# Patient Record
Sex: Female | Born: 1965 | Race: Black or African American | Hispanic: No | Smoking: Never smoker
Health system: Southern US, Community
[De-identification: ages and names within clinical notes are randomized; demographics above are authoritative.]

## PROBLEM LIST (undated history)

## (undated) DIAGNOSIS — D649 Anemia, unspecified: Secondary | ICD-10-CM

## (undated) DIAGNOSIS — E785 Hyperlipidemia, unspecified: Secondary | ICD-10-CM

## (undated) DIAGNOSIS — E119 Type 2 diabetes mellitus without complications: Secondary | ICD-10-CM

## (undated) DIAGNOSIS — I10 Essential (primary) hypertension: Secondary | ICD-10-CM

## (undated) DIAGNOSIS — E079 Disorder of thyroid, unspecified: Secondary | ICD-10-CM

## (undated) HISTORY — DX: Anemia, unspecified: D64.9

## (undated) HISTORY — PX: APPENDECTOMY: SHX54

## (undated) HISTORY — PX: ABDOMINAL HYSTERECTOMY: SHX81

---

## 1998-05-01 ENCOUNTER — Encounter: Admission: RE | Admit: 1998-05-01 | Discharge: 1998-05-01 | Payer: Self-pay | Admitting: *Deleted

## 1998-10-08 ENCOUNTER — Emergency Department (HOSPITAL_COMMUNITY): Admission: EM | Admit: 1998-10-08 | Discharge: 1998-10-08 | Payer: Self-pay | Admitting: Emergency Medicine

## 1999-01-19 ENCOUNTER — Encounter: Admission: RE | Admit: 1999-01-19 | Discharge: 1999-04-19 | Payer: Self-pay | Admitting: Family Medicine

## 1999-11-14 ENCOUNTER — Other Ambulatory Visit: Admission: RE | Admit: 1999-11-14 | Discharge: 1999-11-14 | Payer: Self-pay | Admitting: Obstetrics and Gynecology

## 2000-01-01 ENCOUNTER — Emergency Department (HOSPITAL_COMMUNITY): Admission: EM | Admit: 2000-01-01 | Discharge: 2000-01-01 | Payer: Self-pay | Admitting: Emergency Medicine

## 2003-01-14 ENCOUNTER — Other Ambulatory Visit: Admission: RE | Admit: 2003-01-14 | Discharge: 2003-01-14 | Payer: Self-pay | Admitting: Obstetrics and Gynecology

## 2003-01-19 ENCOUNTER — Encounter: Payer: Self-pay | Admitting: Obstetrics and Gynecology

## 2003-01-19 ENCOUNTER — Ambulatory Visit (HOSPITAL_COMMUNITY): Admission: RE | Admit: 2003-01-19 | Discharge: 2003-01-19 | Payer: Self-pay | Admitting: Obstetrics and Gynecology

## 2003-07-11 ENCOUNTER — Encounter: Admission: RE | Admit: 2003-07-11 | Discharge: 2003-10-09 | Payer: Self-pay | Admitting: Emergency Medicine

## 2003-08-17 ENCOUNTER — Encounter: Admission: RE | Admit: 2003-08-17 | Discharge: 2003-08-17 | Payer: Self-pay | Admitting: Emergency Medicine

## 2003-08-31 ENCOUNTER — Encounter: Admission: RE | Admit: 2003-08-31 | Discharge: 2003-08-31 | Payer: Self-pay | Admitting: Neurosurgery

## 2004-01-16 ENCOUNTER — Emergency Department (HOSPITAL_COMMUNITY): Admission: EM | Admit: 2004-01-16 | Discharge: 2004-01-16 | Payer: Self-pay | Admitting: Emergency Medicine

## 2004-02-17 ENCOUNTER — Encounter: Admission: RE | Admit: 2004-02-17 | Discharge: 2004-02-17 | Payer: Self-pay | Admitting: Emergency Medicine

## 2005-01-21 ENCOUNTER — Emergency Department (HOSPITAL_COMMUNITY): Admission: EM | Admit: 2005-01-21 | Discharge: 2005-01-21 | Payer: Self-pay | Admitting: Family Medicine

## 2005-04-25 IMAGING — CR DG CHEST 2V
2 series · 2 of 2 positions shown · non-contrast
Comparison: none

CLINICAL DATA: Diabetic, not feeling well.   Chest pain.
 TWO VIEW CHEST
 PA and lateral views of the chest show mild bilateral basilar atelectasis and bilateral cervical ribs.  There is no definite acute infiltrate, congestive heart failure, pleural effusion, or pneumothorax.   The heart is normal for the patient?s body habitus.  Bony thorax is normal except for the cervical ribs. 
 IMPRESSION
 Mild bilateral basilar atelectasis.  Bilateral large cervical ribs.  No acute disease.

[view not recorded (1 of 2)]
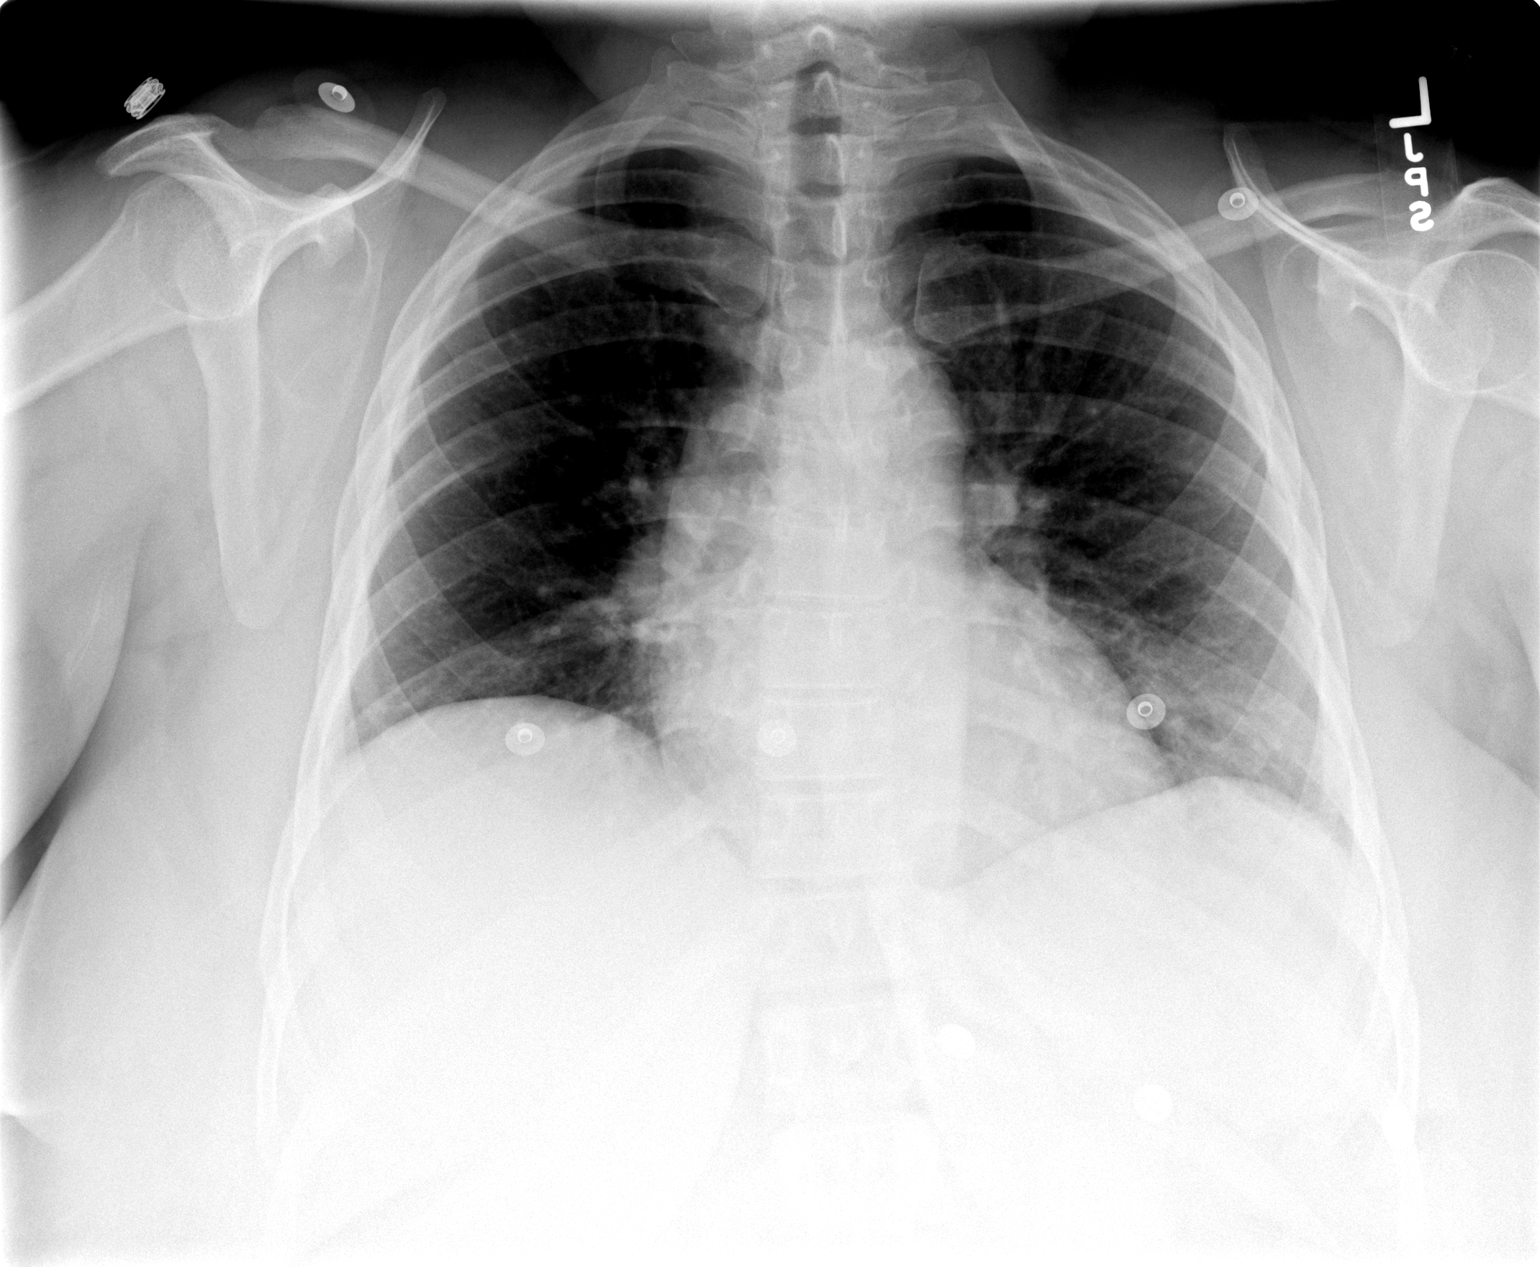

[view not recorded (2 of 2)]
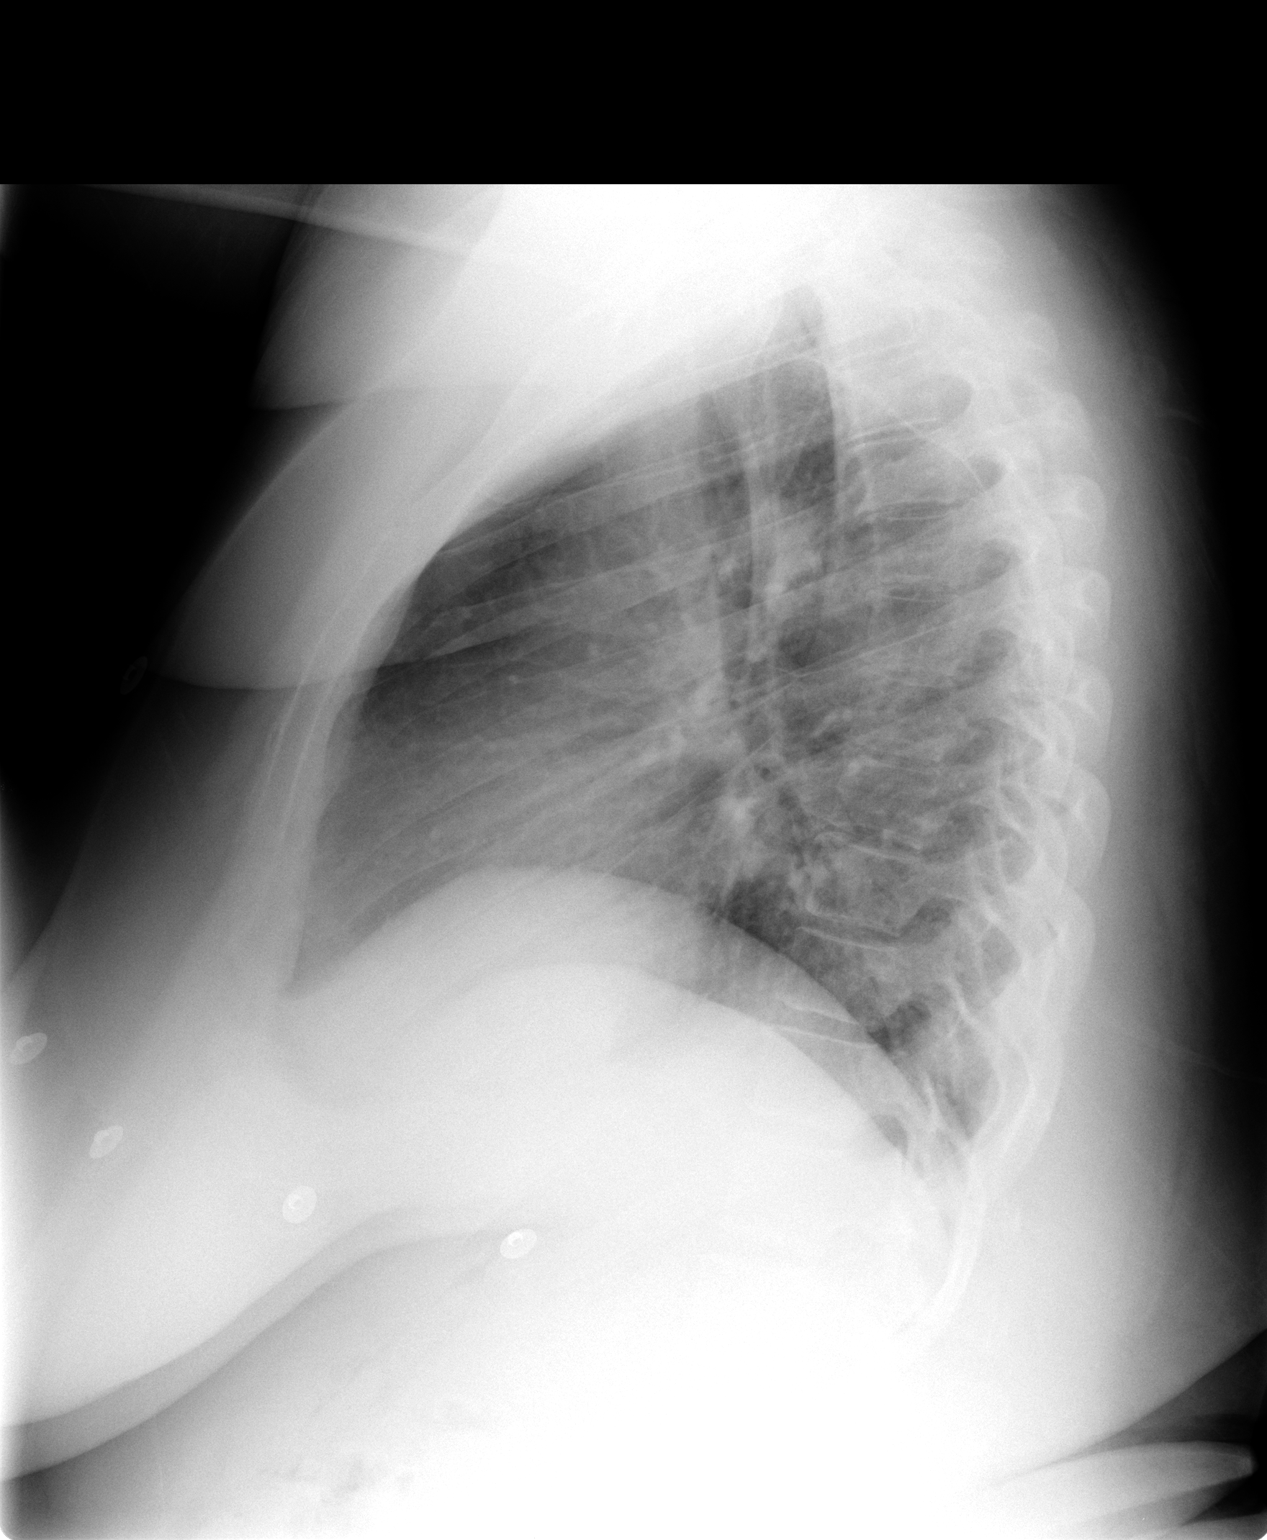

[2 of 2 positions shown; findings below may reference images not displayed]

## 2006-09-02 ENCOUNTER — Emergency Department (HOSPITAL_COMMUNITY): Admission: EM | Admit: 2006-09-02 | Discharge: 2006-09-02 | Payer: Self-pay | Admitting: Emergency Medicine

## 2008-09-13 ENCOUNTER — Ambulatory Visit (HOSPITAL_COMMUNITY): Admission: RE | Admit: 2008-09-13 | Discharge: 2008-09-14 | Payer: Self-pay | Admitting: Obstetrics and Gynecology

## 2008-09-13 ENCOUNTER — Encounter (INDEPENDENT_AMBULATORY_CARE_PROVIDER_SITE_OTHER): Payer: Self-pay | Admitting: Obstetrics and Gynecology

## 2009-06-02 ENCOUNTER — Encounter: Admission: RE | Admit: 2009-06-02 | Discharge: 2009-06-02 | Payer: Self-pay | Admitting: Obstetrics and Gynecology

## 2010-06-15 ENCOUNTER — Encounter: Admission: RE | Admit: 2010-06-15 | Discharge: 2010-06-15 | Payer: Self-pay | Admitting: Family Medicine

## 2010-10-06 ENCOUNTER — Encounter: Payer: Self-pay | Admitting: Neurosurgery

## 2011-01-29 NOTE — Op Note (Signed)
Virginia [name], Virginia [name]             ACCOUNT NO.:  [id number]   MEDICAL RECORD NO.:  [id number]          PATIENT TYPE:  OIB   LOCATION:  9316                          FACILITY:  [facility]   PHYSICIAN:  Zenaida Niece, M.D.DATE OF BIRTH:  1966/08/09   DATE OF PROCEDURE:  09/13/2008  DATE OF DISCHARGE:                               OPERATIVE REPORT   PREOPERATIVE DIAGNOSES:  Abnormal uterine bleeding and leiomyomatous  uterus.   POSTOPERATIVE DIAGNOSES:  Abnormal uterine bleeding and leiomyomatous  uterus.   PROCEDURE:  D&C and laparoscopic supracervical hysterectomy.   SURGEON:  Zenaida Niece, MD   ASSISTANT:  Huel Cote, MD   ANESTHESIA:  General endotracheal tube.   FINDINGS:  She had a large uterus with multiple fibroids.  She had  probable endometriosis at the left uterine cornu with otherwise normal  tubes, ovaries, and abdomen.  No tissue was obtained on the D&C.   SPECIMENS:  Morcellated uterus sent for routine pathology.   ESTIMATED BLOOD LOSS:  200 mL.   COMPLICATIONS:  None.   PROCEDURE IN DETAIL:  The patient was taken to the operating room and  placed in the dorsal supine position.  General anesthesia was induced  and she was placed in mobile stirrups and the shoulder bolsters were  attached to the bed.  Abdomen, perineum, and vagina were then prepped  and draped in the usual sterile fashion.  A Foley catheter was inserted.  A Graves speculum was inserted into the vagina after I double gloved.  The anterior lip of the cervix was grasped with a single-tooth  tenaculum.  The uterus sounded to 10 cm with some difficulty.  The  cervix was then gradually dilated to a size 23 dilator.  A small sharp  curette was inserted and sharp curettage was performed with return of no  significant tissue.  The uterus felt smooth with the curette.  Since no  tissue was obtained, I did not feel suspicious for endometrial  hyperplasia or neoplasia.  Thus, we elected to proceed  with laparoscopic  supracervical hysterectomy.  I took off my overgloves.  Supraumbilical  skin was infiltrated with 0.25% Marcaine due to the position of her  umbilicus on the abdomen.  A 1-cm vertical incision was made.  The  Veress needle was inserted in the peritoneal cavity and placement  confirmed by the water drop test and an opening pressure of 2 mmHg.  CO2  gas was insufflated to a pressure of 14 mmHg and the Veress needle was  removed.  A 5-mm trocar was then introduced through this incision with  direct visualization with a laparoscope.  Good visualization was  achieved.  The uterus was large filling the pelvis.  A 10-mm port was  then placed on the right side under direct visualization and a 12-mm  port on the left side under direct visualization.  The uterus did appear  to be mobile enough to attempt an [facility].  A 10-mm tenaculum was used as  well as the harmonic scalpel ACE.  First on the right side, the round  ligament, utero-ovarian pedicle, and broad ligament  were taken down with  harmonic scalpel ACE.  The superior portion of the right uterine artery  was skeletonized.  It was then taken down with harmonic scalpel with  adequate hemostasis.  On the right side, the right tube and ovary  appeared normal.  On the left side, the left cornu appeared to have some  adhesions and possibly some endometriosis.  The left fallopian tube and  utero-ovarian ligament were taken down with the harmonic scalpel ACE.  The left round ligament and the remainder of the utero-ovarian pedicle  were then taken down also with harmonic scalpel ACE.  This was followed  by the broad ligament.  Uterine artery was skeletonized.  Anterior  peritoneum was incised across the anterior portion of the uterus to push  the bladder inferior.  The left uterine artery was then taken down with  harmonic scalpel with adequate hemostasis.  I then used a drill, clamp,  cut technique with the harmonic scalpel on maximum  power to start  removing the uterus from the cervix from the left side.  Visualization  was slightly difficult due to the patient's obesity.  I was able to come  probably two-thirds of the way across from the left side.  Dr.  Senaida Ores then came across on the right side and removed the rest of  the pedicles again using the harmonic scalpel ACE on maximum power.  This freed the uterus from the cervix.  Bleeding from the posterior edge  of the cervical stump was eventually identified and controlled with the  harmonic scalpel ACE.  This was done after copious irrigation with the  irrigation suction device.  The uterus was placed in the right upper  quadrant to help visualize the pelvis.  Another 5-mm port had to be  placed in the left lower quadrant to help aid in visualization by  pushing bowel out of the way.  Bleeding was eventually able to be  controlled.  The 12-mm port on the left side was then removed and a  Storz morcellator was inserted.  Good visualization was achieved.  The  uterus was then located and brought to the morcellator.  With good  visualization and multiple bites, the uterus was removed through the  morcellator until no significant pieces of the uterus were left.  Again,  this was done with good visualization.  The morcellator was then removed  and a 12-mm port was replaced.  The pelvis was again irrigated and found  to be hemostatic.  A small amount of bleeding from the posterior  cervical stump was again controlled with the harmonic scalpel.  All  other pedicles appeared to be hemostatic.  A piece of Interceed was then  placed over the cervical stump.  The 5-mm port in the left lower  quadrant and a 10-mm port on the right side were removed under direct  visualization.  The 12-mm port was then removed.  The laparoscope was  left in place and used to visualize as I attempted to place a suture  through the fascia at the 12-mm site.  I am not sure whether I closed   fascia or not, but I got the bite as deep as I could with a figure-of-  eight suture of 0-Vicryl.  The remaining trocars were removed after all  gas was allowed to deflate from the abdomen.  Skin incisions were then  closed with interrupted subcuticular sutures of 4-0 Vicryl followed by  Dermabond.  The patient tolerated the procedure well.  She was extubated  in the operating room and taken to the recovery room in stable  condition.  Counts were correct.  She was given Ancef 1 g IV at the  beginning the procedure and had PAS hose on throughout the procedure.      Zenaida Niece, M.D.  Electronically Signed     TDM/MEDQ  D:  09/13/2008  T:  09/14/2008  Job:  [id number]

## 2011-01-29 NOTE — H&P (Signed)
Virginia [name], NAM             ACCOUNT NO.:  [id number]   MEDICAL RECORD NO.:  [id number]         PATIENT TYPE:  WAMB   LOCATION:                                FACILITY:  [facility]   PHYSICIAN:  Zenaida Niece, M.D.DATE OF BIRTH:  04-11-1966   DATE OF ADMISSION:  09/13/2008  DATE OF DISCHARGE:                              HISTORY & PHYSICAL   CHIEF COMPLAINT:  Abnormal uterine bleeding and probable fibroid uterus.   HISTORY OF PRESENT ILLNESS:  This is a 45 year old female para 1-0-0-1  who was seen for an annual exam in June 2008.  At that time, she was  having mostly regular periods that were heavy for 4 days.  She had some  irregular spotting.  She has previously undergone ThermaChoice  endometrial ablation for abnormal bleeding.  Her bleeding then continued  to increase; so, she has elected to proceed with definitive surgical  therapy with hysterectomy.  I was unable to do endometrial biopsy in the  office.  Her cervix was stenotic and she did not tolerate in office  dilation.  I attempted to perform a vaginal ultrasound to assess her  endometrium.  This also proved difficult.  I could see two good size  fibroids but was unable to clearly see the endometrium.  This will have  an effect on the route of surgery which will be discussed in the plan.   PAST OB HISTORY:  One cesarean section at term without complications.   PAST MEDICAL HISTORY:  Hypertension, hypothyroidism, Type II Diabetes.   PAST SURGICAL HISTORY:  Low transverse cesarean section and  appendectomy, as well as the above-mentioned ThermaChoice and  laparoscopic tubal fulguration.   ALLERGIES:  None known.   CURRENT MEDICATIONS:  She is on the blood pressure medicine and she is  unable to tell me the name of this.  She is also on thyroid replacement  and Metformin.   FAMILY HISTORY:  No GYN or colon cancer.   REVIEW OF SYSTEMS:  Normal bowel and bladder function.  No other  significant problems.   PHYSICAL EXAMINATION:  Weight is 329 pounds.  Last blood pressure in the  office was slightly elevated at 180/118.  Generally, this is an obese black female in no acute distress.  Neck is supple without lymphadenopathy or thyromegaly.  Lungs are clear to auscultation.  Heart has a regular rate and rhythm without murmur.  Abdomen is obese, nontender, nondistended without a palpable mass.  She  has a scar in her right lower quadrant and a transverse scar.  Extremities have no edema and are nontender.  On pelvic exam, external genitalia have no lesions.  On speculum exam,  she  has a normal cervix and Pap smear was normal.  The uterus seems to  be well-supported.  On bimanual exam, her uterus is difficult to palpate  and she has no adnexal masses.   ASSESSMENT:  Abnormal uterine bleeding that is persistent after having  ThermaChoice endometrial ablation.  All nonsurgical and surgical options  have been discussed with the patient and she wishes to proceed with  definitive surgical therapy.  All routes and options of hysterectomy  have been discussed.  All risks of surgery have been discussed.   Plan is to admit the patient on the day of surgery.  She will have a D&C  with a frozen section.  We will start her hysterectomy through the  laparoscope.  If the D&C is benign, she will have a laparoscopic  supracervical hysterectomy.  If there is hyperplasia or carcinoma, then  she will have a total laparoscopic hysterectomy.      Zenaida Niece, M.D.  Electronically Signed     TDM/MEDQ  D:  09/12/2008  T:  09/12/2008  Job:  [id number]

## 2011-06-21 LAB — GLUCOSE, CAPILLARY
Glucose-Capillary: 108 mg/dL — ABNORMAL HIGH (ref 70–99)
Glucose-Capillary: 110 mg/dL — ABNORMAL HIGH (ref 70–99)
Glucose-Capillary: 129 mg/dL — ABNORMAL HIGH (ref 70–99)
Glucose-Capillary: 131 mg/dL — ABNORMAL HIGH (ref 70–99)
Glucose-Capillary: 152 mg/dL — ABNORMAL HIGH (ref 70–99)
Glucose-Capillary: 220 mg/dL — ABNORMAL HIGH (ref 70–99)
Glucose-Capillary: 233 mg/dL — ABNORMAL HIGH (ref 70–99)
Glucose-Capillary: 235 mg/dL — ABNORMAL HIGH (ref 70–99)
Glucose-Capillary: 252 mg/dL — ABNORMAL HIGH (ref 70–99)

## 2011-06-21 LAB — BASIC METABOLIC PANEL
BUN: 12 mg/dL (ref 6–23)
CO2: 23 mEq/L (ref 19–32)
Calcium: 8.3 mg/dL — ABNORMAL LOW (ref 8.4–10.5)
Chloride: 92 mEq/L — ABNORMAL LOW (ref 96–112)
Creatinine, Ser: 0.78 mg/dL (ref 0.4–1.2)
GFR calc Af Amer: >60 mL/min (ref >60)
GFR calc non Af Amer: >60 mL/min (ref >60)
Glucose, Bld: 184 mg/dL — ABNORMAL HIGH (ref 70–99)
Potassium: 3.8 mEq/L (ref 3.5–5.1)
Sodium: 123 mEq/L — ABNORMAL LOW (ref 135–145)

## 2011-06-21 LAB — CBC
HCT: 23.3 % — ABNORMAL LOW (ref 36.0–46.0)
HCT: 29.1 % — ABNORMAL LOW (ref 36.0–46.0)
Hemoglobin: 7.4 g/dL — CL (ref 12.0–15.0)
Hemoglobin: 9.1 g/dL — ABNORMAL LOW (ref 12.0–15.0)
MCHC: 31.4 g/dL (ref 30.0–36.0)
MCHC: 31.8 g/dL (ref 30.0–36.0)
MCV: 77.2 fL — ABNORMAL LOW (ref 78.0–100.0)
MCV: 77.9 fL — ABNORMAL LOW (ref 78.0–100.0)
Platelets: 481 10*3/uL — ABNORMAL HIGH (ref 150–400)
Platelets: 573 10*3/uL — ABNORMAL HIGH (ref 150–400)
RBC: 2.99 MIL/uL — ABNORMAL LOW (ref 3.87–5.11)
RBC: 3.77 MIL/uL — ABNORMAL LOW (ref 3.87–5.11)
RDW: 15.6 % — ABNORMAL HIGH (ref 11.5–15.5)
RDW: 15.7 % — ABNORMAL HIGH (ref 11.5–15.5)
WBC: 9.5 10*3/uL (ref 4.0–10.5)
WBC: 9.7 10*3/uL (ref 4.0–10.5)

## 2011-06-21 LAB — PREGNANCY, URINE: Preg Test, Ur: NEGATIVE

## 2013-04-20 ENCOUNTER — Emergency Department (HOSPITAL_COMMUNITY): Payer: 59

## 2013-04-20 ENCOUNTER — Encounter (HOSPITAL_COMMUNITY): Payer: Self-pay | Admitting: Physical Medicine and Rehabilitation

## 2013-04-20 ENCOUNTER — Emergency Department (HOSPITAL_COMMUNITY)
Admission: EM | Admit: 2013-04-20 | Discharge: 2013-04-20 | Disposition: A | Discharge status: Home / Self Care | Payer: 59 | Attending: Emergency Medicine | Admitting: Emergency Medicine

## 2013-04-20 DIAGNOSIS — R0789 Other chest pain: Secondary | ICD-10-CM

## 2013-04-20 DIAGNOSIS — G5600 Carpal tunnel syndrome, unspecified upper limb: Secondary | ICD-10-CM | POA: Insufficient documentation

## 2013-04-20 DIAGNOSIS — E785 Hyperlipidemia, unspecified: Secondary | ICD-10-CM | POA: Insufficient documentation

## 2013-04-20 DIAGNOSIS — I10 Essential (primary) hypertension: Secondary | ICD-10-CM | POA: Insufficient documentation

## 2013-04-20 DIAGNOSIS — E119 Type 2 diabetes mellitus without complications: Secondary | ICD-10-CM | POA: Insufficient documentation

## 2013-04-20 DIAGNOSIS — E079 Disorder of thyroid, unspecified: Secondary | ICD-10-CM | POA: Insufficient documentation

## 2013-04-20 DIAGNOSIS — G5602 Carpal tunnel syndrome, left upper limb: Secondary | ICD-10-CM

## 2013-04-20 HISTORY — DX: Type 2 diabetes mellitus without complications: E11.9

## 2013-04-20 HISTORY — DX: Hyperlipidemia, unspecified: E78.5

## 2013-04-20 HISTORY — DX: Essential (primary) hypertension: I10

## 2013-04-20 HISTORY — DX: Disorder of thyroid, unspecified: E07.9

## 2013-04-20 LAB — BASIC METABOLIC PANEL
BUN: 10 mg/dL (ref 6–23)
Calcium: 9.1 mg/dL (ref 8.4–10.5)
GFR calc Af Amer: >90 mL/min (ref >90)
GFR calc non Af Amer: 85 mL/min — ABNORMAL LOW (ref >90)
Potassium: 3.8 mEq/L (ref 3.5–5.1)
Sodium: 136 mEq/L (ref 135–145)

## 2013-04-20 LAB — CBC
MCHC: 35.4 g/dL (ref 30.0–36.0)
Platelets: 381 10*3/uL (ref 150–400)
RDW: 12.5 % (ref 11.5–15.5)
WBC: 7.8 10*3/uL (ref 4.0–10.5)

## 2013-04-20 NOTE — ED Notes (Signed)
Pt also states diffuse chest pain today. Describes as constant dull sensation. 2/10 at the time.

## 2013-04-20 NOTE — ED Provider Notes (Signed)
Medical screening examination/treatment/procedure(s) were performed by resident-physician practitioner and as supervising physician I was immediately available for consultation/collaboration.  Pt presents with two likely unrelated complaints as above w/ L hand complaint likely due to prolonged typing, as well as intermittent chest aching.  EKG NSR, delta trop x2 not elevated, CXR unremarkable.  Pt safe to f/u for further CP w/u as outpt, about ACS.      Shanna Cisco, MD 04/20/13 [phone]

## 2013-04-20 NOTE — ED Provider Notes (Signed)
CSN: [id number]     Arrival date & time 04/20/13  0759 History     First MD Initiated Contact with Patient 04/20/13 [phone]     Chief Complaint  Patient presents with  . Numbness   (Consider location/radiation/quality/duration/timing/severity/associated sxs/prior Treatment) Patient is a 47 y.o. female presenting with chest pain. The history is provided by the patient.  Chest Pain Pain location:  Substernal area Pain quality: pressure   Pain radiates to:  Does not radiate Pain radiates to the back: no   Pain severity:  Mild Onset quality:  Gradual Timing:  Constant Progression:  Partially resolved Chronicity:  New Relieved by:  Nothing Exacerbated by: nothing. Ineffective treatments:  None tried Associated symptoms: no abdominal pain, no back pain, no cough, no diaphoresis, no dizziness, no dysphagia, no fatigue, no fever, no headache, no nausea, no numbness, no palpitations, no shortness of breath and not vomiting     Past Medical History  Diagnosis Date  . Hypertension   . Diabetes mellitus without complication   . Thyroid disease   . Hyperlipemia    History reviewed. No pertinent past surgical history. History reviewed. No pertinent family history. History  Substance Use Topics  . Smoking status: Never Smoker   . Smokeless tobacco: Not on file  . Alcohol Use: No   OB History   Grav Para Term Preterm Abortions TAB SAB Ect Mult Living                 Review of Systems  Constitutional: Negative for fever, chills, diaphoresis and fatigue.  HENT: Negative for ear pain, congestion, sore throat, facial swelling, mouth sores, trouble swallowing, neck pain and neck stiffness.   Eyes: Negative.   Respiratory: Negative for apnea, cough, chest tightness, shortness of breath and wheezing.   Cardiovascular: Positive for chest pain. Negative for palpitations and leg swelling.  Gastrointestinal: Negative for nausea, vomiting, abdominal pain, diarrhea and abdominal distention.   Genitourinary: Negative for hematuria, flank pain, vaginal discharge, difficulty urinating and menstrual problem.  Musculoskeletal: Negative for back pain and gait problem.  Skin: Negative for rash and wound.  Neurological: Negative for dizziness, tremors, seizures, syncope, facial asymmetry, numbness and headaches.  Psychiatric/Behavioral: Negative.   All other systems reviewed and are negative.    Allergies  Review of patient's allergies indicates no known allergies.  Home Medications   Current Outpatient Rx  Name  Route  Sig  Dispense  Refill  . Ketotifen Fumarate (ALLERGY EYE DROPS OP)   Ophthalmic   Apply 1 drop to eye 2 (two) times daily as needed (allergies).          BP 165/113  Pulse 83  Temp(Src) 99 F (37.2 C) (Oral)  Resp 19  SpO2 100% Physical Exam  Nursing note and vitals reviewed. Constitutional: She is oriented to person, place, and time. She appears well-developed and well-nourished. No distress.  HENT:  Head: Normocephalic and atraumatic.  Right Ear: External ear normal.  Left Ear: External ear normal.  Nose: Nose normal.  Mouth/Throat: Oropharynx is clear and moist. No oropharyngeal exudate.  Eyes: Conjunctivae and EOM are normal. Pupils are equal, round, and reactive to light. Right eye exhibits no discharge. Left eye exhibits no discharge.  Neck: Normal range of motion. Neck supple. No JVD present. No tracheal deviation present. No thyromegaly present.  Cardiovascular: Normal rate, regular rhythm, normal heart sounds and intact distal pulses.  Exam reveals no gallop and no friction rub.   No murmur heard. Pulmonary/Chest: Effort normal  and breath sounds normal. No respiratory distress. She has no wheezes. She has no rales. She exhibits no tenderness.  Abdominal: Soft. Bowel sounds are normal. She exhibits no distension. There is no tenderness. There is no rebound and no guarding.  Musculoskeletal: Normal range of motion.  Patient with positive  Tinnel and Phalen sign in the left wrist  Lymphadenopathy:    She has no cervical adenopathy.  Neurological: She is alert and oriented to person, place, and time. No cranial nerve deficit. Coordination normal.  Skin: Skin is warm. No rash noted. She is not diaphoretic.  Psychiatric: She has a normal mood and affect. Her behavior is normal. Judgment and thought content normal.    ED Course   Procedures (including critical care time)  Labs Reviewed  BASIC METABOLIC PANEL - Abnormal; Notable for the following:    Glucose, Bld 297 (*)    GFR calc non Af Amer 85 (*)    All other components within normal limits  CBC  TROPONIN I  TROPONIN I   Dg Chest 2 View  04/20/2013   *RADIOLOGY REPORT*  Clinical Data: Dry cough.  CHEST - 2 VIEW  Comparison: 01/16/2004  Findings: Heart and mediastinal contours are within normal limits. No focal opacities or effusions.  No acute bony abnormality.  IMPRESSION: No active cardiopulmonary disease.   Original Report Authenticated By: Charlett Nose, M.D.   1. Carpal tunnel syndrome of left wrist   2. Chest pressure     MDM  47 yr old F pt here with 2 complaints. Patient has noticed tingling and burning in the left 3rd through 5th fingers. She says it is worse after prolonged use. 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 It radiates up her wrist. There is a positive Phalen and Tinnel's sign. I think this most likely represents carpal tunnel syndrome. Secondly the patient started noticing intermittent "chest aching" substernally that started about 3 days ago and is waxing and waning. It is not pleuritic and non-exertional. It is a 2/10 right now. Patient has a family hx of heart disease, DM, HTN, so has risk factors for ACS. Her EKG shows NSR, but will evaluate her as a low risk chest pain patient. Her well's score is 0 and is PERC negative. Likely she can follow up with PCP for continued cardiac evaluation.  EKG: normal EKG, normal sinus rhythm, there are  no previous tracings available for comparison.   Normal 4 hour troponin. Will give instructions on what to do for carpal tunnel.  Case discussed with Dr. Catarina Hartshorn, MD 04/20/13 1541  Sherryl Manges, MD 04/20/13 [phone]

## 2013-04-20 NOTE — ED Notes (Signed)
Pt resting quietly at the time. Vital signs stable. States numbness/tingling to L hand and fingers. Denies chest pain. Respirations unlabored. Family at bedside.

## 2013-04-20 NOTE — ED Notes (Signed)
Pt presents to department for evaluation of numbness/tingling to L hand radiating to forearm and up to shoulder. Ongoing since Saturday, no relief of symptoms. 5/10 pain at the time. Pt is conscious alert and oriented x4. Denies chest pain. Respirations unlabored.

## 2013-04-20 NOTE — ED Notes (Signed)
Ortho paged at this time 

## 2013-04-20 NOTE — Progress Notes (Signed)
Orthopedic Tech Progress Note Patient Details:  Virginia [name] 02-Oct-1965 [id number] Applied Velcro splint to LUE.  Pulses, sensation, motion, capillary refill intact before and after splinting.  Capillary refill less than 2 seconds. Ortho Devices Type of Ortho Device: Wrist splint Ortho Device/Splint Location: LUE Ortho Device/Splint Interventions: Application   Lesle Chris 04/20/2013, 2:57 PM

## 2013-10-15 ENCOUNTER — Other Ambulatory Visit: Payer: Self-pay | Admitting: Physician Assistant

## 2013-10-15 ENCOUNTER — Ambulatory Visit (INDEPENDENT_AMBULATORY_CARE_PROVIDER_SITE_OTHER): Payer: 59 | Admitting: Physician Assistant

## 2013-10-15 ENCOUNTER — Telehealth: Payer: Self-pay

## 2013-10-15 VITALS — BP 180/128 | HR 94 | Temp 98.2°F | Resp 18 | Ht 62.5 in | Wt 302.0 lb

## 2013-10-15 DIAGNOSIS — R05 Cough: Secondary | ICD-10-CM

## 2013-10-15 DIAGNOSIS — J069 Acute upper respiratory infection, unspecified: Secondary | ICD-10-CM

## 2013-10-15 DIAGNOSIS — E039 Hypothyroidism, unspecified: Secondary | ICD-10-CM

## 2013-10-15 DIAGNOSIS — R059 Cough, unspecified: Secondary | ICD-10-CM

## 2013-10-15 DIAGNOSIS — E1059 Type 1 diabetes mellitus with other circulatory complications: Secondary | ICD-10-CM

## 2013-10-15 DIAGNOSIS — R062 Wheezing: Secondary | ICD-10-CM

## 2013-10-15 DIAGNOSIS — I10 Essential (primary) hypertension: Secondary | ICD-10-CM

## 2013-10-15 DIAGNOSIS — R0602 Shortness of breath: Secondary | ICD-10-CM

## 2013-10-15 DIAGNOSIS — E119 Type 2 diabetes mellitus without complications: Secondary | ICD-10-CM

## 2013-10-15 LAB — POCT CBC
GRANULOCYTE PERCENT: 44.7 % (ref 37–80)
HCT, POC: 47.8 % (ref 37.7–47.9)
Hemoglobin: 14.8 g/dL (ref 12.2–16.2)
Lymph, poc: 2.8 (ref 0.6–3.4)
MCH: 30.1 pg (ref 27–31.2)
MCHC: 31 g/dL — AB (ref 31.8–35.4)
MCV: 97.3 fL — AB (ref 80–97)
MID (CBC): 0.6 (ref 0–0.9)
MPV: 8.5 fL (ref 0–99.8)
PLATELET COUNT, POC: 360 10*3/uL (ref 142–424)
POC Granulocyte: 2.8 (ref 2–6.9)
POC LYMPH %: 44.9 % (ref 10–50)
POC MID %: 10.4 % (ref 0–12)
RBC: 4.91 M/uL (ref 4.04–5.48)
RDW, POC: 13.4 %
WBC: 6.2 10*3/uL (ref 4.6–10.2)

## 2013-10-15 LAB — COMPREHENSIVE METABOLIC PANEL
ALT: 17 U/L (ref 0–35)
AST: 16 U/L (ref 0–37)
Albumin: 4.1 g/dL (ref 3.5–5.2)
Alkaline Phosphatase: 75 U/L (ref 39–117)
BILIRUBIN TOTAL: 0.6 mg/dL (ref 0.2–1.2)
BUN: 13 mg/dL (ref 6–23)
CO2: 28 meq/L (ref 19–32)
CREATININE: 0.84 mg/dL (ref 0.50–1.10)
Calcium: 9.5 mg/dL (ref 8.4–10.5)
Chloride: 99 mEq/L (ref 96–112)
GLUCOSE: 255 mg/dL — AB (ref 70–99)
Potassium: 4 mEq/L (ref 3.5–5.3)
Sodium: 136 mEq/L (ref 135–145)
Total Protein: 7.8 g/dL (ref 6.0–8.3)

## 2013-10-15 LAB — POCT GLYCOSYLATED HEMOGLOBIN (HGB A1C): Hemoglobin A1C: 10.7

## 2013-10-15 LAB — GLUCOSE, POCT (MANUAL RESULT ENTRY): POC GLUCOSE: 260 mg/dL — AB (ref 70–99)

## 2013-10-15 LAB — POCT INFLUENZA A/B
INFLUENZA B, POC: NEGATIVE
Influenza A, POC: NEGATIVE

## 2013-10-15 LAB — TSH: TSH: 4.831 u[IU]/mL — AB (ref 0.350–4.500)

## 2013-10-15 MED ORDER — AZITHROMYCIN 250 MG PO TABS: ORAL_TABLET | ORAL | Status: DC

## 2013-10-15 MED ORDER — METFORMIN HCL 500 MG PO TABS: 500.0000 mg | ORAL_TABLET | Freq: Two times a day (BID) | ORAL | Status: DC

## 2013-10-15 MED ORDER — INSULIN GLARGINE 100 UNIT/ML SOLOSTAR PEN: PEN_INJECTOR | SUBCUTANEOUS | Status: DC

## 2013-10-15 MED ORDER — ALBUTEROL SULFATE (2.5 MG/3ML) 0.083% IN NEBU
2.5000 mg | INHALATION_SOLUTION | Freq: Once | RESPIRATORY_TRACT | Status: AC
Start: 2013-10-15 — End: 2013-10-15
  Administered 2013-10-15: 2.5 mg via RESPIRATORY_TRACT

## 2013-10-15 MED ORDER — HYDROCODONE-HOMATROPINE 5-1.5 MG/5ML PO SYRP: ORAL_SOLUTION | ORAL | Status: DC

## 2013-10-15 MED ORDER — IPRATROPIUM BROMIDE 0.06 % NA SOLN: 2.0000 | Freq: Three times a day (TID) | NASAL | Status: DC

## 2013-10-15 MED ORDER — LISINOPRIL-HYDROCHLOROTHIAZIDE 20-12.5 MG PO TABS: 1.0000 | ORAL_TABLET | Freq: Every day | ORAL | Status: DC

## 2013-10-15 NOTE — Telephone Encounter (Signed)
Patient states that she was seen today and got a [facility] however she needs the needles to go with it. [facility] [address]  [phone]

## 2013-10-15 NOTE — Progress Notes (Signed)
Subjective:    Patient ID: Virginia [name], female    DOB: 11-27-1965, 47 y.o.   MRN: [id number]  HPI 48 year old female presents with a 5 day history of nasal congestion, post nasal drip, sore throat, and cough. Mild sinus pressure. Subjective fever and chills. Nasal congestion thick and green/yellow. Cough is sometimes productive of yellow sputum, but is mostly not productive. Cough is not associated with time of day. Ears feel full, leading to sensation of muffled hearing. Has tried OTC cold preps without success. Some diarrhea. Appetite decreased. Multiple sick contacts at work. No recent antibiotics or recent travels. She did get an influenza vaccine this season. No leg trauma, sedentary periods, h/o cancer, or tobacco use.  Her blood pressure is generally high because she has not had her medications in over 2 years. She cannot remember what she is supposed to be taking for her blood pressure. She was taking taking medication for hypertension, hypothyroid, and AODM. She was having to use insulin to control her blood sugars. The only medications she can remember are metformin and insulin. Her last sugar in Epic was 297 on 04/20/13. She was not on any medications at that time. She was using [facility] on [address] when she was taking her medications.   She has been a diabetic since her early 61's or late 20's. She was diagnosed with hypothyroidism in her early 20's. She was diagnosed with hypertension in her last 20's or early 56's.    PMH: Past Medical History  Diagnosis Date  . Hypertension   . Diabetes mellitus without complication   . Thyroid disease   . Hyperlipemia      Home Meds: Prior to Admission medications   Medication Sig Start Date End Date Taking? Authorizing Provider           Allergies: No Known Allergies  History   Social History  . Marital Status: Divorced    Spouse Name: N/A    Number of Children: N/A  . Years of Education: N/A   Occupational History  . Not  on file.   Social History Main Topics  . Smoking status: Never Smoker   . Smokeless tobacco: Not on file  . Alcohol Use: No  . Drug Use: No  . Sexual Activity: Not on file   Other Topics Concern  . Not on file   Social History Narrative  . No narrative on file      Review of Systems  Constitutional: Positive for fever, chills, appetite change and fatigue.  HENT: Positive for congestion, rhinorrhea, sinus pressure and sneezing. Negative for ear pain, hearing loss, postnasal drip and sore throat.        Nasal and chest congestion. Some laryngitis.   Respiratory: Positive for cough, chest tightness, shortness of breath and wheezing.        Cough is sometimes productive of yellow sputum.  Cough is not associated with time of day.   Gastrointestinal: Positive for diarrhea. Negative for nausea and vomiting.       Diarrhea associated with coughing and sneezing.   Endocrine: Positive for polydipsia, polyphagia and polyuria.       Positive for nocturia.   Genitourinary: Positive for urgency and frequency.  Musculoskeletal: Positive for myalgias.  Neurological: Positive for headaches.       Headache is located bilateral temples.        Objective:   Physical Exam  Physical Exam: Blood pressure 180/128, pulse 94, temperature 98.2 F (36.8 C), temperature  source Oral, resp. rate 18, height 5' 2.5" (1.588 m), weight 302 lb (136.986 kg), SpO2 96.00%., Body mass index is 54.32 kg/(m^2). General: Well developed, well nourished, in no acute distress. Head: Normocephalic, atraumatic, eyes without discharge, sclera non-icteric, nares are congested. Bilateral auditory canals clear, TM's are without perforation, pearly grey with reflective cone of light bilaterally. No sinus TTP. Oral cavity moist, dentition normal. Posterior pharynx with post nasal drip and mild erythema. No peritonsillar abscess or tonsillar exudate. Uvula midline.  Neck: Supple. No thyromegaly. Full ROM. No  lymphadenopathy. Lungs: Clear to auscultation bilaterally with mild wheezing. No rales or rhonchi. Breathing is unlabored. Status post albuterol neb: A little better.  Heart: RRR with S1 S2. No murmurs, rubs, or gallops appreciated. Msk:  Strength and tone normal for age. Extremities: No clubbing or cyanosis. No edema. Neuro: Alert and oriented X 3. Moves all extremities spontaneously. CNII-XII grossly in tact. Psych:  Responds to questions appropriately with a normal affect.    Labs: Results for orders placed in visit on 10/15/13  POCT CBC      Result Value Range   WBC 6.2  4.6 - 10.2 K/uL   Lymph, poc 2.8  0.6 - 3.4   POC LYMPH PERCENT 44.9  10 - 50 %L   MID (cbc) 0.6  0 - 0.9   POC MID % 10.4  0 - 12 %M   POC Granulocyte 2.8  2 - 6.9   Granulocyte percent 44.7  37 - 80 %G   RBC 4.91  4.04 - 5.48 M/uL   Hemoglobin 14.8  12.2 - 16.2 g/dL   HCT, POC 47.8  37.7 - 47.9 %   MCV 97.3 (*) 80 - 97 fL   MCH, POC 30.1  27 - 31.2 pg   MCHC 31.0 (*) 31.8 - 35.4 g/dL   RDW, POC 13.4     Platelet Count, POC 360  142 - 424 K/uL   MPV 8.5  0 - 99.8 fL  POCT INFLUENZA A/B      Result Value Range   Influenza A, POC Negative     Influenza B, POC Negative    GLUCOSE, POCT (MANUAL RESULT ENTRY)      Result Value Range   POC Glucose 260 (*) 70 - 99 mg/dl  POCT GLYCOSYLATED HEMOGLOBIN (HGB A1C)      Result Value Range   Hemoglobin A1C 10.7      CMP and TSH pending     Assessment & Plan:  48 year old female with uncontrolled AODM, uncontrolled hypertension, hypothyroidism, and URI. She has been off her chronic medications now for over 2 years.   1) AODM -Uncontrolled -Restart metformin 500 mg 1 po bid #60 RF 2 -Lantus Inject 10 units qhs. Advance by 2 units every 2 days until a FBS of 130. RF 11 -She declines Glipizide at this time -Has glucometer, lancets, and test strips at home -Advised patient further medications will be necessary including a statin and ASA -She will need DDS and  eye MD exam -She will need PNA vaccine -Up to date with influenza vaccine -Recheck 1 month  2) Hypertension -Uncontrolled -Start lisinopril/HCTZ 20/12.5 mg 1 po daily #90 no RF -Healthy diet and exercise -Weight loss -Recheck 1 month -Await CMP  3) Hypothyroidism -Await TSH -Plan to start levothyroxine based on TSH results  4) URI -Azithromycin 250 MG #6 2 po first day then 1 po next 4 days no RF -Atrovent NS 0.06% 2 sprays each nare  bid prn #1 no RF -Hycodan #4oz 1 tsp po q 4-6 hours prn cough no RF SED -Mucinex -Rest/fluids -RTC precautions   Christell Faith, MHS, PA-C [facility] [address] [place], [place] [address] [place] 10/15/2013 12:07 PM

## 2013-10-16 ENCOUNTER — Other Ambulatory Visit: Payer: Self-pay | Admitting: Physician Assistant

## 2013-10-16 DIAGNOSIS — E039 Hypothyroidism, unspecified: Secondary | ICD-10-CM

## 2013-10-16 LAB — T3: T3 TOTAL: 84.3 ng/dL (ref 80.0–204.0)

## 2013-10-16 LAB — T4, FREE: FREE T4: 0.91 ng/dL (ref 0.80–1.80)

## 2013-10-16 MED ORDER — INSULIN PEN NEEDLE 32G X 6 MM MISC: Status: DC

## 2013-10-16 MED ORDER — LEVOTHYROXINE SODIUM 25 MCG PO TABS
25.0000 ug | ORAL_TABLET | Freq: Every day | ORAL | Status: DC
Start: 2013-10-16 — End: 2014-03-01

## 2013-10-16 NOTE — Telephone Encounter (Signed)
Sent in

## 2013-10-16 NOTE — Telephone Encounter (Signed)
Called pt LMOM, advised Rx sent in.

## 2013-10-16 NOTE — Addendum Note (Signed)
Addended by: Rise Mu on: 10/16/2013 07:54 AM   Modules accepted: Orders

## 2014-03-01 ENCOUNTER — Ambulatory Visit (INDEPENDENT_AMBULATORY_CARE_PROVIDER_SITE_OTHER): Payer: 59 | Admitting: Emergency Medicine

## 2014-03-01 ENCOUNTER — Ambulatory Visit (INDEPENDENT_AMBULATORY_CARE_PROVIDER_SITE_OTHER): Payer: 59

## 2014-03-01 VITALS — BP 152/102 | HR 105 | Temp 98.8°F | Resp 18 | Ht 62.5 in | Wt 311.8 lb

## 2014-03-01 DIAGNOSIS — E119 Type 2 diabetes mellitus without complications: Secondary | ICD-10-CM

## 2014-03-01 DIAGNOSIS — M25569 Pain in unspecified knee: Secondary | ICD-10-CM

## 2014-03-01 DIAGNOSIS — M25561 Pain in right knee: Secondary | ICD-10-CM

## 2014-03-01 DIAGNOSIS — IMO0002 Reserved for concepts with insufficient information to code with codable children: Secondary | ICD-10-CM

## 2014-03-01 DIAGNOSIS — M1710 Unilateral primary osteoarthritis, unspecified knee: Secondary | ICD-10-CM

## 2014-03-01 DIAGNOSIS — M1711 Unilateral primary osteoarthritis, right knee: Secondary | ICD-10-CM

## 2014-03-01 MED ORDER — MELOXICAM 15 MG PO TABS: 15.0000 mg | ORAL_TABLET | Freq: Every day | ORAL | Status: DC

## 2014-03-01 MED ORDER — GLUCOSE BLOOD VI STRP: ORAL_STRIP | Status: DC

## 2014-03-01 NOTE — Progress Notes (Signed)
   Subjective:    Patient ID: Virginia [name], female    DOB: 1966/04/03, 48 y.o.   MRN: [id number]  HPI  48 yo female with right knee pain.  Onset gradually over last week.  No injury.  No swelling.  Hurst on anterior and popliteal fossa.  Worse when standing or rising from chair.  No radiation of pain.  Worse on stairs.  PPMH:  Morbid obesity, diabetes, hypertension  SH:  Nonsmoker, no alcohol  Review of Systems  Constitutional: Negative for fever and chills.  Respiratory: Negative for cough, shortness of breath and wheezing.   Cardiovascular: Negative for chest pain and leg swelling.  Endocrine: Negative for polydipsia and polyuria.  Musculoskeletal: Positive for arthralgias. Negative for back pain, gait problem, joint swelling and myalgias.  Skin: Negative for color change and rash.       Objective:   Physical Exam Blood pressure 152/102, pulse 105, temperature 98.8 F (37.1 C), temperature source Oral, resp. rate 18, height 5' 2.5" (1.588 m), weight 311 lb 12.8 oz (141.432 kg), SpO2 96.00%. Body mass index is 56.08 kg/(m^2). Well-developed, well nourished female who is awake, alert and oriented, in NAD. HEENT: [place]/AT, PERRL, EOMI.  Sclera and conjunctiva are clear.  Heart: no pedal edema Lungs: normal effort Extremities: right knee:  Mild ttp med/lat joint line, no ligamentous laxity, no effusion, mild patellar creptitus. Skin: warm and dry without rash. Psychologic: good mood and appropriate affect, normal speech and behavior.   Right knee Xray:  Moderate DJD of right knee.     Assessment & Plan:  Right Knee pain Right Knee osteoarthritis Diabetes  Will start on Mobic daily.  Refill diabetic test strips.  If no improvement patient will follow up and consider corticosteroid injection in to knee.  Deferred today as she has not been following blood sugar closely recently.

## 2014-03-02 NOTE — Progress Notes (Signed)
I have read, reviewed and agree with note by Dr. Meredith Staggers. Lamar Blinks, MD

## 2015-02-07 ENCOUNTER — Encounter (HOSPITAL_COMMUNITY): Payer: Self-pay | Admitting: Vascular Surgery

## 2015-02-07 ENCOUNTER — Emergency Department (HOSPITAL_COMMUNITY)
Admission: EM | Admit: 2015-02-07 | Discharge: 2015-02-07 | Disposition: A | Discharge status: Home / Self Care | Payer: 59 | Attending: Emergency Medicine | Admitting: Emergency Medicine

## 2015-02-07 DIAGNOSIS — R002 Palpitations: Secondary | ICD-10-CM | POA: Diagnosis present

## 2015-02-07 DIAGNOSIS — Z794 Long term (current) use of insulin: Secondary | ICD-10-CM | POA: Insufficient documentation

## 2015-02-07 DIAGNOSIS — I10 Essential (primary) hypertension: Secondary | ICD-10-CM | POA: Insufficient documentation

## 2015-02-07 DIAGNOSIS — Z791 Long term (current) use of non-steroidal anti-inflammatories (NSAID): Secondary | ICD-10-CM | POA: Insufficient documentation

## 2015-02-07 DIAGNOSIS — Z79899 Other long term (current) drug therapy: Secondary | ICD-10-CM | POA: Diagnosis not present

## 2015-02-07 DIAGNOSIS — E119 Type 2 diabetes mellitus without complications: Secondary | ICD-10-CM | POA: Insufficient documentation

## 2015-02-07 LAB — BASIC METABOLIC PANEL
Anion gap: 10 (ref 5–15)
BUN: 9 mg/dL (ref 6–20)
CO2: 23 mmol/L (ref 22–32)
Calcium: 8.8 mg/dL — ABNORMAL LOW (ref 8.9–10.3)
Chloride: 100 mmol/L — ABNORMAL LOW (ref 101–111)
Creatinine, Ser: 0.75 mg/dL (ref 0.44–1.00)
GFR calc Af Amer: >60 mL/min (ref >60)
GFR calc non Af Amer: >60 mL/min (ref >60)
Glucose, Bld: 315 mg/dL — ABNORMAL HIGH (ref 65–99)
Potassium: 4 mmol/L (ref 3.5–5.1)
Sodium: 133 mmol/L — ABNORMAL LOW (ref 135–145)

## 2015-02-07 LAB — CBC WITH DIFFERENTIAL/PLATELET
Basophils Absolute: 0 10*3/uL (ref 0.0–0.1)
Basophils Relative: 1 % (ref 0–1)
Eosinophils Absolute: 0.1 10*3/uL (ref 0.0–0.7)
Eosinophils Relative: 2 % (ref 0–5)
HCT: 41 % (ref 36.0–46.0)
Hemoglobin: 14.2 g/dL (ref 12.0–15.0)
Lymphocytes Relative: 32 % (ref 12–46)
Lymphs Abs: 2.2 10*3/uL (ref 0.7–4.0)
MCH: 30.8 pg (ref 26.0–34.0)
MCHC: 34.6 g/dL (ref 30.0–36.0)
MCV: 88.9 fL (ref 78.0–100.0)
Monocytes Absolute: 0.4 10*3/uL (ref 0.1–1.0)
Monocytes Relative: 6 % (ref 3–12)
Neutro Abs: 4.3 10*3/uL (ref 1.7–7.7)
Neutrophils Relative %: 59 % (ref 43–77)
Platelets: 384 10*3/uL (ref 150–400)
RBC: 4.61 MIL/uL (ref 3.87–5.11)
RDW: 12.3 % (ref 11.5–15.5)
WBC: 7.1 10*3/uL (ref 4.0–10.5)

## 2015-02-07 MED ORDER — SODIUM CHLORIDE 0.9 % IV BOLUS (SEPSIS)
1000.0000 mL | Freq: Once | INTRAVENOUS | Status: AC
  Administered 2015-02-07: 1000 mL via INTRAVENOUS

## 2015-02-07 NOTE — ED Provider Notes (Signed)
CSN: [id number]     Arrival date & time 02/07/15  1331 History   First MD Initiated Contact with Patient 02/07/15 1334     Chief Complaint  Patient presents with  . Palpitations   HPI   49 year old female presents with sensation of a racing heart. She reports that over the past 2 weeks she's had episodes of a palpitations, these last for approximately one hour. She reports that occasionally she has tachypnea which she is able to control with breathing exercises, but denies true shortness of breath with these episodes. Patient denies chest pain, or pain anywhere else in her body associated with these episodes. She denies abnormal sensation of the heart, just the sensation of it beating rapidly. She states that today she had an episode of diaphoresis associated with this, no nausea, vomiting, chest pain. Patient denies significant past medical history, reports that she has a history of hypertension and has not been taking any medication for the last 6 months. Reports that she does drink caffeine, denies illicit drug use, alcohol, tobacco. She denies any lower extremity swelling or edema, recent surgeries, prolonged immobilization, malignancy, smoking history, history DVT PEs, coagulopathies, or any other concerning signs or symptoms for PE DVT. Patient reports that today's episode resolved without intervention, at time of evaluation she had no shortness of breath, chest pain, sensation of rapid heart rate.   Past Medical History  Diagnosis Date  . Hypertension   . Diabetes mellitus without complication   . Thyroid disease   . Hyperlipemia    Past Surgical History  Procedure Laterality Date  . Abdominal hysterectomy     Family History  Problem Relation Age of Onset  . Heart disease Mother   . Hypertension Mother   . Stroke Father    History  Substance Use Topics  . Smoking status: Never Smoker   . Smokeless tobacco: Not on file  . Alcohol Use: No   OB History    No data available      Review of Systems  All other systems reviewed and are negative.   Allergies  Review of patient's allergies indicates no known allergies.  Home Medications   Prior to Admission medications   Medication Sig Start Date End Date Taking? Authorizing Provider  ibuprofen (ADVIL,MOTRIN) 400 MG tablet Take 400 mg by mouth every 8 (eight) hours as needed for mild pain.   Yes Historical Provider, MD  glucose blood (FREESTYLE LITE) test strip Use as instructed Patient not taking: Reported on 02/07/2015 03/01/14   Hennie Duos, MD  Insulin Glargine (LANTUS SOLOSTAR) 100 UNIT/ML Solostar Pen Inject 10 units qhs. Increase by 2 units every 2 days until fasting blood sugar of 130. 10/15/13   Ryan M Dunn, PA-C  Insulin Pen Needle 32G X 6 MM MISC Inject 10 units daily at bed time. Advance 2 units every 2 days until a fasting blood sugar of 130. Patient not taking: Reported on 02/07/2015 10/16/13   Rise Mu, PA-C  lisinopril-hydrochlorothiazide (ZESTORETIC) 20-12.5 MG per tablet Take 1 tablet by mouth daily. 10/15/13   Rise Mu, PA-C  meloxicam (MOBIC) 15 MG tablet Take 1 tablet (15 mg total) by mouth daily. 03/01/14   Hennie Duos, MD  metFORMIN (GLUCOPHAGE) 500 MG tablet Take 1 tablet (500 mg total) by mouth 2 (two) times daily with a meal. 10/15/13   Rise Mu, PA-C   BP 122/84 mmHg  Pulse 87  Temp(Src) 98.4 F (36.9 C) (Oral)  Resp 14  Ht 5\' 3"  (1.6 m)  Wt 315 lb (142.883 kg)  BMI 55.81 kg/m2  SpO2 98%   Physical Exam  Constitutional: She is oriented to person, place, and time. She appears well-developed and well-nourished.  HENT:  Head: Normocephalic and atraumatic.  Eyes: Pupils are equal, round, and reactive to light.  Neck: Normal range of motion. Neck supple. No JVD present. No tracheal deviation present. No thyromegaly present.  Cardiovascular: Regular rhythm, normal heart sounds and intact distal pulses.  Exam reveals no gallop and no friction rub.   No murmur  heard. Pulmonary/Chest: Effort normal and breath sounds normal. No stridor. No respiratory distress. She has no wheezes. She has no rales. She exhibits no tenderness.  Abdominal: Soft. There is no tenderness.  Musculoskeletal: Normal range of motion.  No lower extremity swelling or edema  Lymphadenopathy:    She has no cervical adenopathy.  Neurological: She is alert and oriented to person, place, and time. Coordination normal.  Skin: Skin is warm and dry.  Psychiatric: She has a normal mood and affect. Her behavior is normal. Judgment and thought content normal.  Nursing note and vitals reviewed.   ED Course  Procedures (including critical care time) Labs Review Labs Reviewed  CBC WITH DIFFERENTIAL/PLATELET  BASIC METABOLIC PANEL    Imaging Review No results found.   EKG Interpretation   Date/Time:  Tuesday Feb 07 2015 13:44:33 EDT Ventricular Rate:  101 PR Interval:  172 QRS Duration: 88 QT Interval:  372 QTC Calculation: 482 R Axis:   -37 Text Interpretation:  Sinus tachycardia Atrial premature complex Inferior  infarct, old No significant change since last tracing Confirmed by  HARRISON  MD, FORREST ([id number]) on 02/07/2015 2:09:06 PM      MDM   Final diagnoses:  Palpitations    Labs: CBC, BMP  Imaging: EKG  Consults: None  Therapeutics: Normal saline  Assessment:  Palpitations  Plan: Patient presents with heart palpitations. She reports her last 2 weeks she's had these, not caused by any specific event. She reports they can be happening while resting, or ambulating. She reports they resolve on their own without intervention. She denies chest pain, shortness of breath, nausea, vomiting, or any other concerning signs or symptoms. Patient has no chest pain, or associated symptoms that would indicate ACS. She has no significant risk factors for PE or DVT, perc negative. Laboratory data noncontributory. Patient was given instructions to follow up with primary care  for further evaluation and management, conditions including her hypertension and elevated glucose. Patient patient was instructed to inform her primary care of her recent ED visit and all relevant data, she was given strict return precautions the event new or worsening signs or symptoms presented. Patient verbalized her understanding and agreement for today's plan and had no further questions or concerns at the time of discharge.      Okey Regal, PA-C 02/07/15 [place], MD 02/08/15 [phone]

## 2015-02-07 NOTE — ED Notes (Signed)
Pt in hallway so unable to obtain EKG within 10 mins

## 2015-02-07 NOTE — ED Notes (Signed)
Pt was sitting at her desk at work and she developed palpitations, lightheadedness, and SOB. Reports she has been having these episodes intermittently for approx 1 week and they last approx 1 hour. Upon EMS arrival she was sinus tach in the 130s with occasional PVCs. She was also hypertensive PTA at 190s/100s. She has been out of her HTN and DM medications x 6 months. She can no longer afford these medicines. En route her HR came down to the upper 90s and low 100s and she reported her SOB decreased. At this time she is just complaining of some lightheadedness. Pt denies any CP or HA. Pt A&Ox4, resp e/u, and skin warm and dry.

## 2015-02-07 NOTE — Discharge Instructions (Signed)
°Emergency Department Resource Guide °1) Find a Doctor and Pay Out of Pocket °Although you won't have to find out who is covered by your insurance plan, it is a good idea to ask around and get recommendations. You will then need to call the office and see if the doctor you have chosen will accept you as a new patient and what types of options they offer for patients who are self-pay. Some doctors offer discounts or will set up payment plans for their patients who do not have insurance, but you will need to ask so you aren't surprised when you get to your appointment. ° °2) Contact Your Local Health Department °Not all health departments have doctors that can see patients for sick visits, but many do, so it is worth a call to see if yours does. If you don't know where your local health department is, you can check in your phone book. The CDC also has a tool to help you locate your state's health department, and many state websites also have listings of all of their local health departments. ° °3) Find a Walk-in Clinic °If your illness is not likely to be very severe or complicated, you may want to try a walk in clinic. These are popping up all over the country in pharmacies, drugstores, and shopping centers. They're usually staffed by nurse practitioners or physician assistants that have been trained to treat common illnesses and complaints. They're usually fairly quick and inexpensive. However, if you have serious medical issues or chronic medical problems, these are probably not your best option. ° °No Primary Care Doctor: °- Call [facility] at  832-8000 - they can help you locate a primary care doctor that  accepts your insurance, provides certain services, etc. °- Physician Referral Service- 1-800-533-3463 ° °Chronic Pain Problems: °Organization         Address  Phone   Notes  °[facility]  (336) 297-2271 Patients need to be referred by their primary care doctor.  ° °Medication  Assistance: °Organization         Address  Phone   Notes  °[place] [facility] [address] °[place], [place] [address] (336) 641-8030 --Must be a resident of [place] °-- Must have NO insurance coverage whatsoever (no Medicaid/ Medicare, etc.) °-- The pt. MUST have a primary care doctor that directs their care regularly and follows them in the community °  °[facility]  (866) 331-1348   °[employer]  (888) 892-1162   ° °Agencies that provide inexpensive medical care: °Organization         Address  Phone   Notes  °[facility]  (336) 832-8035   °[facility]    (336) 832-7272   °[facility] [address] °[place], [place] [address] (336) 832-4777   °[facility] [address], °[place] (336) 271-4999   °[facility]    (336) 373-0678   °[facility]    (336) 272-1050   °[facility] ° [address], [place] Phone:  (336) 832-4444, Fax:  (336) 832-4440 Hours of Operation:  9 am - 6 pm, M-F.  Also accepts Medicaid/Medicare and self-pay.  °[facility] ° [address],  Phone: (336) 832-3150, Fax: (336) 832-3151. Hours of Operation:  8:30 am - 5:30 pm, M-F.  Also accepts Medicaid and self-pay.  °[facility] [place] [address], [place] Phone: (336) 878-6027   °[facility] [address], [place], [place] (336)723-1848, Ext. 123 Mondays & Thursdays: 7-9 AM.  First 15 patients are seen on a first come, first serve basis. °  ° °Medicaid-accepting [place] Providers: ° °Organization         Address  Phone   Notes  °[facility] [address], [place] (336) 641-2100 Also accepts self-pay patients.  °[facility] [address], [place] ° (336) 856-9996   °[facility] [address], [place]  (336) 288-8857   °[facility] [address] [place] [address], [place] (336) 299-7000   °Veita Bland [address], [place]  ° (336) 373-1557 Only accepts [place] Access Medicaid patients after they have their name applied to their card.  ° °Self-Pay (no insurance) in [place]: ° °Organization         Address  Phone   Notes  °Sickle Cell Patients, [facility] [address], [place] (336) 832-1970   °[facility] [address], [place] (336) 832-4400   °[facility] ° [address], [place] (336) 992-4800   °[facility]/Dr. Osei-Bonsu ° [address] [place] [address], [place] or [address], [place] (336) 841-8500 Phone number for both [place] and [place] locations is the same.  °[facility] [address], [place] (336) 299-0000   °[facility] [address] [place] [address], [place] or [address] (336) 852-7530 °(336) 878-2260   °[facility] [address], [place] (336) 350-1642, phone; (336) 294-5005, fax Sees patients 1st and 3rd Saturday of every month.  Must not qualify for public or private insurance (i.e. Medicaid, Medicare, [place] Health Choice, Veterans' Benefits) • Household income should be no more than 200% of the poverty level •The clinic cannot treat you if you are pregnant or think you are pregnant • Sexually transmitted diseases are not treated at the clinic.  ° ° °Dental Care: °Organization         Address  Phone  Notes  °[place] [facility] [address], [place] (336) 641-6152 Accepts children up to age 21 who are enrolled in Medicaid or [place] Health Choice; pregnant women with a Medicaid card; and children who have applied for Medicaid or [place] Health Choice, but were declined, whose parents can pay a reduced fee at time of service.  °[place]  [facility] [place]  [address], [place] (336) 641-7733 Accepts children up to age 21 who are enrolled in Medicaid or [place] Health Choice; pregnant women with a Medicaid card; and children who have applied for Medicaid or [place] Health Choice, but were declined, whose parents can pay a reduced fee at time of service.  °[facility] ° [address], [place] (336) 641-4533 Patients are seen by appointment only. Walk-ins are not accepted. [facility] will see patients 18 years of age and older. °Monday - Tuesday (8am-5pm) °Most Wednesdays (8:30-5pm) °$30 per visit, cash only  °[facility] ° [address], [place] (336) 641-4533 Patients are seen by appointment only. Walk-ins are not accepted. [facility] will see patients 18 years of age and older. °One   Wednesday Evening (Monthly: Volunteer Based).  $30 per visit, cash only  °[facility]  (919) 537-3737 for adults; Children under age 4, call Graduate Pediatric Dentistry at (919) [phone]. Children aged 4-14, please call (919) [phone] to request a pediatric application. ° Dental services are provided in all areas of dental care including fillings, crowns and bridges, complete and partial dentures, implants, gum treatment, root canals, and extractions. Preventive care is also provided. Treatment is provided to both adults and children. °Patients are selected via a lottery and there is often a waiting list. °  °[facility] [address], °[place] ° (336) 763-8833 [email or web address] °  °[facility] [address], [place], [place] (336)723-1848, Ext. 123 Second and Fourth Thursday of each month, opens at 6:30 AM; Clinic ends at 9 AM.  Patients are seen on a first-come first-served basis, and a limited number are seen during each clinic.  ° °[facility] ° [address], [place], [place] (336) 723-7904    Eligibility Requirements °You must have lived in [place], [place], or [place] counties for at least the last three months. °  You cannot be eligible for state or federal sponsored healthcare insurance, including Veterans Administration, Medicaid, or Medicare. °  You generally cannot be eligible for healthcare insurance through your employer.  °  How to apply: °Eligibility screenings are held every Tuesday and Wednesday afternoon from 1:00 pm until 4:00 pm. You do not need an appointment for the interview!  °[facility] [address], [place], [place] 336-631-2330   °[facility]  336-342-8273   °[facility]  336-703-3100   °[facility]  336-570-6415   ° °Behavioral Health Resources in the Community: °Intensive Outpatient Programs °Organization         Address  Phone  Notes  °[facility] [address], [place], [place] 336-878-6098   °[facility] [address], [place], [place] 336-832-9800   °[facility] [address], [place], [place] ° 336-882-2125   °[facility] [address],  °[place], [place] 1-800-853-5163 or 336-641-4981   °Substance Abuse Resources °Organization         Address  Phone  Notes  °[facility]  336-882-2125   °[facility]  336-784-9470   °[facility]  336-285-9073   °[facility]  336-845-3988   °Residential & Outpatient Substance Abuse Program  1-800-659-3381   °Psychological Services °Organization         Address  Phone  Notes  °[facility]  336- 832-9600   °[facility]  336- 378-7881   °[facility] [address], [place] 1-800-853-5163 or 336-641-4981   ° °Mobile Crisis Teams °Organization         Address  Phone  Notes  °[facility], Mobile Crisis Care Unit  1-877-626-1772   °Assertive °[facility] ° [address]  [place], [place] 336-834-9664   °Sharon DeEsch [address] °[place] 336-554-5454   ° °Self-Help/Support Groups °Organization         Address  Phone             Notes  °[facility] [place] - variety of support groups  336- 373-1402 Call for more information  °Narcotics Anonymous (NA), [facility] [address], °[place]  2 meetings at this location  ° °  Residential Treatment Programs Organization         Address  Phone  Notes  [facility] [address],    [place]  1-3430238143   [facility]  [address], [place] [address], [place], [place]   [place], [place] [phone] Admissions: 8am-3pm M-F  [facility] [place] [address],    [place], [place] 076-808-8110   [facility] [address] [place], [place], [place]   [facility] [address],  [place], [place]   [facility] Intensive Outpatient [address], [place], [place]   [facility] ([place]  [place], [place] 1-732-118-5419 or 218-044-3963   [facility] [address], [place], [place] Accepts Medicaid  [facility] [address]  [place] 1-(386) 793-6173 Substance Abuse/Addiction Treatment   [facility]         Address  Phone  Notes  [facility]  360-224-0716   Domenic Schwab, PhD [address] [place], [place]   3015291609 or (458)[phone]   [place], [place] [phone]   [facility] [address] [place], [place] [phone] Insurance/Medicaid/sponsorship through [facility] [address]                                    [place], [place] (325)[phone] [place] [address], [place] [phone]    Dr. Adele Schilder  [phone]   [facility] Dept. 1) [address] [place] 2) [place] 3)  [address], [place] [phone]  [phone]   [place] (210)[phone] or ([phone] (After Hours)      Please follow-up with attached primary care provider for further evaluation and management of her chronic symptoms. Please inform them of your elevated glucose and blood pressure readings.

## 2015-02-08 ENCOUNTER — Encounter: Payer: Self-pay | Admitting: Family Medicine

## 2015-02-08 ENCOUNTER — Ambulatory Visit: Payer: 59 | Attending: Family Medicine | Admitting: Family Medicine

## 2015-02-08 VITALS — BP 152/92 | HR 95 | Wt 307.2 lb

## 2015-02-08 DIAGNOSIS — I10 Essential (primary) hypertension: Secondary | ICD-10-CM | POA: Diagnosis not present

## 2015-02-08 DIAGNOSIS — E119 Type 2 diabetes mellitus without complications: Secondary | ICD-10-CM | POA: Insufficient documentation

## 2015-02-08 DIAGNOSIS — E1310 Other specified diabetes mellitus with ketoacidosis without coma: Secondary | ICD-10-CM

## 2015-02-08 DIAGNOSIS — E1110 Type 2 diabetes mellitus with ketoacidosis without coma: Secondary | ICD-10-CM

## 2015-02-08 LAB — COMPLETE METABOLIC PANEL WITH GFR
ALT: 19 U/L (ref 0–35)
AST: 14 U/L (ref 0–37)
Albumin: 3.8 g/dL (ref 3.5–5.2)
Alkaline Phosphatase: 67 U/L (ref 39–117)
BUN: 15 mg/dL (ref 6–23)
CALCIUM: 9.5 mg/dL (ref 8.4–10.5)
CO2: 25 mEq/L (ref 19–32)
Chloride: 101 mEq/L (ref 96–112)
Creat: 1.01 mg/dL (ref 0.50–1.10)
GFR, EST AFRICAN AMERICAN: 76 mL/min
GFR, EST NON AFRICAN AMERICAN: 66 mL/min
Glucose, Bld: 369 mg/dL — ABNORMAL HIGH (ref 70–99)
POTASSIUM: 4.6 meq/L (ref 3.5–5.3)
SODIUM: 136 meq/L (ref 135–145)
TOTAL PROTEIN: 7.4 g/dL (ref 6.0–8.3)
Total Bilirubin: 0.5 mg/dL (ref 0.2–1.2)

## 2015-02-08 LAB — CBC WITH DIFFERENTIAL/PLATELET
Basophils Absolute: 0.1 10*3/uL (ref 0.0–0.1)
Basophils Relative: 1 % (ref 0–1)
Eosinophils Absolute: 0.2 10*3/uL (ref 0.0–0.7)
Eosinophils Relative: 2 % (ref 0–5)
HEMATOCRIT: 42.7 % (ref 36.0–46.0)
Hemoglobin: 13.9 g/dL (ref 12.0–15.0)
LYMPHS PCT: 32 % (ref 12–46)
Lymphs Abs: 2.7 10*3/uL (ref 0.7–4.0)
MCH: 30.2 pg (ref 26.0–34.0)
MCHC: 32.6 g/dL (ref 30.0–36.0)
MCV: 92.8 fL (ref 78.0–100.0)
MONO ABS: 0.5 10*3/uL (ref 0.1–1.0)
MPV: 10.1 fL (ref 8.6–12.4)
Monocytes Relative: 6 % (ref 3–12)
NEUTROS PCT: 59 % (ref 43–77)
Neutro Abs: 5 10*3/uL (ref 1.7–7.7)
Platelets: 448 10*3/uL — ABNORMAL HIGH (ref 150–400)
RBC: 4.6 MIL/uL (ref 3.87–5.11)
RDW: 12.8 % (ref 11.5–15.5)
WBC: 8.4 10*3/uL (ref 4.0–10.5)

## 2015-02-08 LAB — GLUCOSE, POCT (MANUAL RESULT ENTRY): POC Glucose: 330 mg/dl — AB (ref 70–99)

## 2015-02-08 LAB — TSH: TSH: 4.303 u[IU]/mL (ref 0.350–4.500)

## 2015-02-08 LAB — POCT GLYCOSYLATED HEMOGLOBIN (HGB A1C): Hemoglobin A1C: 11.2

## 2015-02-08 MED ORDER — TRUEPLUS LANCETS 26G MISC: Status: AC

## 2015-02-08 MED ORDER — TRUE METRIX METER W/DEVICE KIT: PACK | Status: AC

## 2015-02-08 MED ORDER — INSULIN ASPART 100 UNIT/ML ~~LOC~~ SOLN
10.0000 [IU] | Freq: Once | SUBCUTANEOUS | Status: AC
  Administered 2015-02-08: 10 [IU] via SUBCUTANEOUS

## 2015-02-08 MED ORDER — INSULIN PEN NEEDLE 32G X 6 MM MISC: Status: DC

## 2015-02-08 MED ORDER — GLUCOSE BLOOD VI STRP: ORAL_STRIP | Status: DC

## 2015-02-08 MED ORDER — INSULIN GLARGINE 100 UNIT/ML SOLOSTAR PEN: PEN_INJECTOR | SUBCUTANEOUS | Status: DC

## 2015-02-08 MED ORDER — LISINOPRIL-HYDROCHLOROTHIAZIDE 20-12.5 MG PO TABS: 1.0000 | ORAL_TABLET | Freq: Every day | ORAL | Status: DC

## 2015-02-08 MED ORDER — METFORMIN HCL 500 MG PO TABS: 500.0000 mg | ORAL_TABLET | Freq: Two times a day (BID) | ORAL | Status: DC

## 2015-02-08 MED ORDER — FREESTYLE LANCETS MISC: Status: DC

## 2015-02-08 MED ORDER — GLUCOSE BLOOD VI STRP: ORAL_STRIP | Status: AC

## 2015-02-08 NOTE — Patient Instructions (Signed)
Start back on medications for diabetes and hypertension as ordered. Check BS 2 times a day, alternating the times, some times fasting in AM, sometimes 2 hours after eating, sometimes at bedtime. Sometimes before lunch or dinner. Make follow-up appointment to see nurse for recheck of blood pressure and blood sugars in 2 weeks. Bring a copy of blood sugar readings. Make an appointment with primary provider assigned in one month.

## 2015-02-08 NOTE — Progress Notes (Signed)
Patient ID: Virginia [name], female   DOB: 1966/02/12, 49 y.o.   MRN: [id number]   Virginia [name], is a 49 y.o. female  [id number]:[id number]  [id number]  DOB - [birth year]  CC:  Chief Complaint  Patient presents with  . New pateint    High Blood Pressure   . Diabetes       HPI: Virginia [name] is a 49 y.o. female here today to establish medical care. She has a history of Type II diabetes, hypertension but has no medications in at least six months. She was recently seen in ED for intermittent palpitations. No diagnosis was made as ECG was normal at the time. They asked her to follow-up here as a new patient. She is not currently having any palpitation.  She reports occassional lightheadedness but if unsure if it related to the palpitations. She currently has a stuffy nose, stratchy throat and hoarseness. She request a note for work until Monday.  No Known Allergies Past Medical History  Diagnosis Date  . Hypertension   . Diabetes mellitus without complication   . Thyroid disease   . Hyperlipemia    Current Outpatient Prescriptions on File Prior to Visit  Medication Sig Dispense Refill  . glucose blood (FREESTYLE LITE) test strip Use as instructed (Patient not taking: Reported on 02/07/2015) 100 each 12  . ibuprofen (ADVIL,MOTRIN) 400 MG tablet Take 400 mg by mouth every 8 (eight) hours as needed for mild pain.    . Insulin Glargine (LANTUS SOLOSTAR) 100 UNIT/ML Solostar Pen Inject 10 units qhs. Increase by 2 units every 2 days until fasting blood sugar of 130. (Patient not taking: Reported on 02/08/2015) 5 pen 11  . Insulin Pen Needle 32G X 6 MM MISC Inject 10 units daily at bed time. Advance 2 units every 2 days until a fasting blood sugar of 130. (Patient not taking: Reported on 02/07/2015) 100 each 11  . lisinopril-hydrochlorothiazide (ZESTORETIC) 20-12.5 MG per tablet Take 1 tablet by mouth daily. (Patient not taking: Reported on 02/08/2015) 90 tablet 0  . meloxicam (MOBIC) 15 MG  tablet Take 1 tablet (15 mg total) by mouth daily. (Patient not taking: Reported on 02/08/2015) 30 tablet 1  . metFORMIN (GLUCOPHAGE) 500 MG tablet Take 1 tablet (500 mg total) by mouth 2 (two) times daily with a meal. (Patient not taking: Reported on 02/08/2015) 180 tablet 0   No current facility-administered medications on file prior to visit.   Family History  Problem Relation Age of Onset  . Heart disease Mother   . Hypertension Mother   . Stroke Father    History   Social History  . Marital Status: Divorced    Spouse Name: N/A  . Number of Children: N/A  . Years of Education: N/A   Occupational History  . Not on file.   Social History Main Topics  . Smoking status: Never Smoker   . Smokeless tobacco: Not on file  . Alcohol Use: No  . Drug Use: No  . Sexual Activity: Not on file   Other Topics Concern  . Not on file   Social History Narrative    Review of Systems: Constitutional: Negative for fever, chills, weight loss, Positive for some decrease in appetite and  fatigue. HENT: Negative for ear pain, ear discharge Positive for occassional.nose bleeds at night. Current has some nasal congestion and hoarsness. Eyes: Negative for pain, discharge, redness,  and visual disturbance  Positive for itchy eyes at times Neck: Negative for pain,  Positive for  occasional stiffness Respiratory: Positive  for cough, shortness of breath occasionally, may be related to palpitations   Cardiovascular: Negative for chest pain Positive for palpitations and leg swelling. Gastrointestinal: Negative for abdominal distention, abdominal pain, nausea, vomiting, diarrhea, constipations Genitourinary: Negative for dysuria, urgency, frequency, hematuria, flank pain,Musculoskeletal: Negative for back pain, arthralgia and gait problem.Negative for weakness.Positive for right knee pain Neurological: Negative for tremors, seizures, syncope,   light-headedness, Positive for occassional lightneadness  numbness of left foot and toes and headaches.  Hematological: Negative for easy bruising or bleeding Psychiatric/Behavioral: Negative for anxiety, decreased concentration, confusion Positive for intermittent depression, no sucidial ideations.    Objective:   Filed Vitals:   02/08/15 0955  BP: 152/92  Pulse:     Physical Exam: Constitutional: Patient appears well-developed and well-nourished. No distress. HENT: Normocephalic, atraumatic, External right and left ear normal. Oropharynx is clear. Nose appears minorly inflammmed. Voice is hoarse. Eyes: Conjunctivae and EOM are normal. PERRLA, no scleral icterus. Neck: Normal ROM. Neck supple. No lymphadenopathy, No thyromegaly. CVS: RRR, S1/S2 +, no murmurs, no gallops, no rubs. There is minor ankle edema. Pulse strong and equal Pulmonary: Effort and breath sounds normal, no stridor, rhonchi, wheezes, rales.  Abdominal: Soft. Normoactive BS,, no distension, tenderness, rebound or guarding.  Musculoskeletal: Normal range of motion. No edema and no tenderness.  Neuro: Alert.Normal muscle tone coordination. Non-focal Skin: Skin is warm and dry. No rash noted. Not diaphoretic. No erythema. No pallor. Psychiatric: Normal mood and affect. Behavior, judgment, thought content normal.  Lab Results  Component Value Date   WBC 7.1 02/07/2015   HGB 14.2 02/07/2015   HCT 41.0 02/07/2015   MCV 88.9 02/07/2015   PLT 384 02/07/2015   Lab Results  Component Value Date   CREATININE 0.75 02/07/2015   BUN 9 02/07/2015   NA 133* 02/07/2015   K 4.0 02/07/2015   CL 100* 02/07/2015   CO2 23 02/07/2015    Lab Results  Component Value Date   HGBA1C 10.7 10/15/2013   Lipid Panel  No results found for: CHOL, TRIG, HDL, CHOLHDL, VLDL, LDLCALC     Assessment and plan:   1. DM Type II without know complications  - Glucose (CBG) - POCT glycosylated hemoglobin (Hb A1C)  Restart Lantus at 10 units nightly and titrate 2 units everyother day if BS  over 150  Restart metformin at 500 mg bid. Check BS 2 times a day at various times and bring for a nurse visit in 2 weeks and office visit in one month.  2. Hypertension (essential)     Restart amlodipine and HCTZ at previous doses     Dash diet.     Follow-up in 2 weeks with nurse for BS and BP check and bring records of readings.     Follow-up in one month with assigned PCP      The patient was given clear instructions to go to ER or return to medical center if symptoms don't improve, worsen or new problems develop. The patient verbalized understanding. The patient was told to call to get lab results if they haven't heard anything in the next week.        Micheline Chapman, MSN, FNP-BC [place], [place]   02/08/2015, 10:16 AM

## 2015-02-14 ENCOUNTER — Telehealth: Payer: Self-pay | Admitting: Family Medicine

## 2015-02-14 NOTE — Telephone Encounter (Signed)
Pt had questions about her medication intake for meloxicam (MOBIC) 15 MG tablet. Please follow up with pt.

## 2015-02-22 ENCOUNTER — Ambulatory Visit: Payer: 59 | Attending: Family Medicine | Admitting: *Deleted

## 2015-02-22 VITALS — BP 168/106 | HR 91 | Temp 98.8°F | Resp 16 | Ht 63.0 in | Wt 318.8 lb

## 2015-02-22 DIAGNOSIS — E1165 Type 2 diabetes mellitus with hyperglycemia: Secondary | ICD-10-CM | POA: Diagnosis not present

## 2015-02-22 DIAGNOSIS — I10 Essential (primary) hypertension: Secondary | ICD-10-CM

## 2015-02-22 LAB — GLUCOSE, POCT (MANUAL RESULT ENTRY): POC Glucose: 211 mg/dl — AB (ref 70–99)

## 2015-02-22 MED ORDER — AMLODIPINE BESYLATE 10 MG PO TABS: 10.0000 mg | ORAL_TABLET | Freq: Every day | ORAL | Status: DC

## 2015-02-22 NOTE — Progress Notes (Signed)
Patient presents for BP check, CBG and record review for T2DM after restarting meds Med list reviewed; patient reports taking all meds as directed Started with 10 units lantus. Increased by 2 units nightly when any BS throughout day > 150 Took 26 units last evening Also taking metformin 500 mg bid with meals Taking Lisinopril-HCTZ 20-12.5 mg daily C/o nagging cough since restarting lisinopril especially at night when trying to sleep Patient's AM fasting blood sugars ranging 85-199 Patient's before lunch blood sugars ranging 126-238 Patient's before dinner blood sugars ranging 96-223 Patient 2 hours after breakfast blood sugars 130 and 155 (2 readings) Patient 2 hours after lunch blood sugars ranging 119-234 (3 readings) Patient 2 hours after dinner blood sugars ranging162-225 (4 readings) Denies increased thirst and urination States when BS was 85 felt shaky. Knew to drink OJ right away and felt better S/sx of hypoglycemia and immediate actions to take discussed in detail Taking ibuprofen 600 mg approx twice weekly for right knee arthritis pain  CBG 199 AM fasting per patient record CBG 211 3 hours after egg, cheese and sausage croissant   Lab Results  Component Value Date   HGBA1C 11.2 02/08/2015   Filed Vitals:   02/22/15 1148  BP: 168/106  Pulse: 91  Temp: 98.8 F (37.1 C)  Resp: 16  WT 318.8 lb  Denies headache, blurred vision, or chest pain Positive for SHOB. Patient states this is due to being overweight and is normal for her  Discussed need for low sodium diet and using Mrs. Dash as alternative to salt Encouraged to choose foods with 5% or less of daily value for sodium. Patient recently joined gym  Patient has home BP monitoring device. Patient instructed on most accurate way to take BP, to keep BP log with date and time and bring log to all future visits.  Per PCP: D/c lisinopril/HCTZ Start amlodipine 10 mg daily Keep lantus at 26 units at 10 pm Okay to not  give clonidine   Patient advised to call for med refills at least 7 days before running out so as not to go without.  Patient to return in 2 weeks to establish care with PCP  Patient given literature on [place], Diabetes and Food, Diabetes and Exercise, Basic Carb Counting, Diabetes and Foot Care, and Hypoglycemia

## 2015-02-22 NOTE — Patient Instructions (Addendum)
Diabetes and Foot Care Diabetes may cause you to have problems because of poor blood supply (circulation) to your feet and legs. This may cause the skin on your feet to become thinner, break easier, and heal more slowly. Your skin may become dry, and the skin may peel and crack. You may also have nerve damage in your legs and feet causing decreased feeling in them. You may not notice minor injuries to your feet that could lead to infections or more serious problems. Taking care of your feet is one of the most important things you can do for yourself.  HOME CARE INSTRUCTIONS  Wear shoes at all times, even in the house. Do not go barefoot. Bare feet are easily injured.  Check your feet daily for blisters, cuts, and redness. If you cannot see the bottom of your feet, use a mirror or ask someone for help.  Wash your feet with warm water (do not use hot water) and mild soap. Then pat your feet and the areas between your toes until they are completely dry. Do not soak your feet as this can dry your skin.  Apply a moisturizing lotion or petroleum jelly (that does not contain alcohol and is unscented) to the skin on your feet and to dry, brittle toenails. Do not apply lotion between your toes.  Trim your toenails straight across. Do not dig under them or around the cuticle. File the edges of your nails with an emery board or nail file.  Do not cut corns or calluses or try to remove them with medicine.  Wear clean socks or stockings every day. Make sure they are not too tight. Do not wear knee-high stockings since they may decrease blood flow to your legs.  Wear shoes that fit properly and have enough cushioning. To break in new shoes, wear them for just a few hours a day. This prevents you from injuring your feet. Always look in your shoes before you put them on to be sure there are no objects inside.  Do not cross your legs. This may decrease the blood flow to your feet.  If you find a minor scrape,  cut, or break in the skin on your feet, keep it and the skin around it clean and dry. These areas may be cleansed with mild soap and water. Do not cleanse the area with peroxide, alcohol, or iodine.  When you remove an adhesive bandage, be sure not to damage the skin around it.  If you have a wound, look at it several times a day to make sure it is healing.  Do not use heating pads or hot water bottles. They may burn your skin. If you have lost feeling in your feet or legs, you may not know it is happening until it is too late.  Make sure your health care provider performs a complete foot exam at least annually or more often if you have foot problems. Report any cuts, sores, or bruises to your health care provider immediately. SEEK MEDICAL CARE IF:   You have an injury that is not healing.  You have cuts or breaks in the skin.  You have an ingrown nail.  You notice redness on your legs or feet.  You feel burning or tingling in your legs or feet.  You have pain or cramps in your legs and feet.  Your legs or feet are numb.  Your feet always feel cold. SEEK IMMEDIATE MEDICAL CARE IF:   There is increasing redness,   swelling, or pain in or around a wound.  There is a red line that goes up your leg.  Pus is coming from a wound.  You develop a fever or as directed by your health care provider.  You notice a bad smell coming from an ulcer or wound. Document Released: 08/30/2000 Document Revised: 05/05/2013 Document Reviewed: 02/09/2013 [facility] Patient Information 2015 [place], [place]. This information is not intended to replace advice given to you by your health care provider. Make sure you discuss any questions you have with your health care provider. Diabetes and Exercise Exercising regularly is important. It is not just about losing weight. It has many health benefits, such as:  Improving your overall fitness, flexibility, and endurance.  Increasing your bone  density.  Helping with weight control.  Decreasing your body fat.  Increasing your muscle strength.  Reducing stress and tension.  Improving your overall health. People with diabetes who exercise gain additional benefits because exercise:  Reduces appetite.  Improves the body's use of blood sugar (glucose).  Helps lower or control blood glucose.  Decreases blood pressure.  Helps control blood lipids (such as cholesterol and triglycerides).  Improves the body's use of the hormone insulin by:  Increasing the body's insulin sensitivity.  Reducing the body's insulin needs.  Decreases the risk for heart disease because exercising:  Lowers cholesterol and triglycerides levels.  Increases the levels of good cholesterol (such as high-density lipoproteins [HDL]) in the body.  Lowers blood glucose levels. YOUR ACTIVITY PLAN  Choose an activity that you enjoy and set realistic goals. Your health care provider or diabetes educator can help you make an activity plan that works for you. Exercise regularly as directed by your health care provider. This includes:  Performing resistance training twice a week such as push-ups, sit-ups, lifting weights, or using resistance bands.  Performing 150 minutes of cardio exercises each week such as walking, running, or playing sports.  Staying active and spending no more than 90 minutes at one time being inactive. Even short bursts of exercise are good for you. Three 10-minute sessions spread throughout the day are just as beneficial as a single 30-minute session. Some exercise ideas include:  Taking the dog for a walk.  Taking the stairs instead of the elevator.  Dancing to your favorite song.  Doing an exercise video.  Doing your favorite exercise with a friend. RECOMMENDATIONS FOR EXERCISING WITH TYPE 1 OR TYPE 2 DIABETES   Check your blood glucose before exercising. If blood glucose levels are greater than 240 mg/dL, check for urine  ketones. Do not exercise if ketones are present.  Avoid injecting insulin into areas of the body that are going to be exercised. For example, avoid injecting insulin into:  The arms when playing tennis.  The legs when jogging.  Keep a record of:  Food intake before and after you exercise.  Expected peak times of insulin action.  Blood glucose levels before and after you exercise.  The type and amount of exercise you have done.  Review your records with your health care provider. Your health care provider will help you to develop guidelines for adjusting food intake and insulin amounts before and after exercising.  If you take insulin or oral hypoglycemic agents, watch for signs and symptoms of hypoglycemia. They include:  Dizziness.  Shaking.  Sweating.  Chills.  Confusion.  Drink plenty of water while you exercise to prevent dehydration or heat stroke. Body water is lost during exercise and must be  replaced.  Talk to your health care provider before starting an exercise program to make sure it is safe for you. Remember, almost any type of activity is better than none. Document Released: 11/23/2003 Document Revised: 01/17/2014 Document Reviewed: 02/09/2013 [facility] Patient Information 2015 [place], [place]. This information is not intended to replace advice given to you by your health care provider. Make sure you discuss any questions you have with your health care provider. Diabetes Mellitus and Food It is important for you to manage your blood sugar (glucose) level. Your blood glucose level can be greatly affected by what you eat. Eating healthier foods in the appropriate amounts throughout the day at about the same time each day will help you control your blood glucose level. It can also help slow or prevent worsening of your diabetes mellitus. Healthy eating may even help you improve the level of your blood pressure and reach or maintain a healthy weight.  HOW CAN FOOD  AFFECT ME? Carbohydrates Carbohydrates affect your blood glucose level more than any other type of food. Your dietitian will help you determine how many carbohydrates to eat at each meal and teach you how to count carbohydrates. Counting carbohydrates is important to keep your blood glucose at a healthy level, especially if you are using insulin or taking certain medicines for diabetes mellitus. Alcohol Alcohol can cause sudden decreases in blood glucose (hypoglycemia), especially if you use insulin or take certain medicines for diabetes mellitus. Hypoglycemia can be a life-threatening condition. Symptoms of hypoglycemia (sleepiness, dizziness, and disorientation) are similar to symptoms of having too much alcohol.  If your health care provider has given you approval to drink alcohol, do so in moderation and use the following guidelines:  Women should not have more than one drink per day, and men should not have more than two drinks per day. One drink is equal to:  12 oz of beer.  5 oz of wine.  1 oz of hard liquor.  Do not drink on an empty stomach.  Keep yourself hydrated. Have water, diet soda, or unsweetened iced tea.  Regular soda, juice, and other mixers might contain a lot of carbohydrates and should be counted. WHAT FOODS ARE NOT RECOMMENDED? As you make food choices, it is important to remember that all foods are not the same. Some foods have fewer nutrients per serving than other foods, even though they might have the same number of calories or carbohydrates. It is difficult to get your body what it needs when you eat foods with fewer nutrients. Examples of foods that you should avoid that are high in calories and carbohydrates but low in nutrients include:  Trans fats (most processed foods list trans fats on the Nutrition Facts label).  Regular soda.  Juice.  Candy.  Sweets, such as cake, pie, doughnuts, and cookies.  Fried foods. WHAT FOODS CAN I EAT? Have  nutrient-rich foods, which will nourish your body and keep you healthy. The food you should eat also will depend on several factors, including:  The calories you need.  The medicines you take.  Your weight.  Your blood glucose level.  Your blood pressure level.  Your cholesterol level. You also should eat a variety of foods, including:  Protein, such as meat, poultry, fish, tofu, nuts, and seeds (lean animal proteins are best).  Fruits.  Vegetables.  Dairy products, such as milk, cheese, and yogurt (low fat is best).  Breads, grains, pasta, cereal, rice, and beans.  Fats such as olive oil,  trans fat-free margarine, canola oil, avocado, and olives. DOES EVERYONE WITH DIABETES MELLITUS HAVE THE SAME MEAL PLAN? Because every person with diabetes mellitus is different, there is not one meal plan that works for everyone. It is very important that you meet with a dietitian who will help you create a meal plan that is just right for you. Document Released: 05/30/2005 Document Revised: 09/07/2013 Document Reviewed: 07/30/2013 [facility] Patient Information 2015 [place], [place]. This information is not intended to replace advice given to you by your health care provider. Make sure you discuss any questions you have with your health care provider. Basic Carbohydrate Counting for Diabetes Mellitus Carbohydrate counting is a method for keeping track of the amount of carbohydrates you eat. Eating carbohydrates naturally increases the level of sugar (glucose) in your blood, so it is important for you to know the amount that is okay for you to have in every meal. Carbohydrate counting helps keep the level of glucose in your blood within normal limits. The amount of carbohydrates allowed is different for every person. A dietitian can help you calculate the amount that is right for you. Once you know the amount of carbohydrates you can have, you can count the carbohydrates in the foods you want to  eat. Carbohydrates are found in the following foods:  Grains, such as breads and cereals.  Dried beans and soy products.  Starchy vegetables, such as potatoes, peas, and corn.  Fruit and fruit juices.  Milk and yogurt.  Sweets and snack foods, such as cake, cookies, candy, chips, soft drinks, and fruit drinks. CARBOHYDRATE COUNTING There are two ways to count the carbohydrates in your food. You can use either of the methods or a combination of both. Reading the "Nutrition Facts" on [place] The "Nutrition Facts" is an area that is included on the labels of almost all packaged food and beverages in the [place]. It includes the serving size of that food or beverage and information about the nutrients in each serving of the food, including the grams (g) of carbohydrate per serving.  Decide the number of servings of this food or beverage that you will be able to eat or drink. Multiply that number of servings by the number of grams of carbohydrate that is listed on the label for that serving. The total will be the amount of carbohydrates you will be having when you eat or drink this food or beverage. Learning Standard Serving Sizes of Food When you eat food that is not packaged or does not include "Nutrition Facts" on the label, you need to measure the servings in order to count the amount of carbohydrates.A serving of most carbohydrate-rich foods contains about 15 g of carbohydrates. The following list includes serving sizes of carbohydrate-rich foods that provide 15 g ofcarbohydrate per serving:   1 slice of bread (1 oz) or 1 six-inch tortilla.    of a hamburger bun or English muffin.  4-6 crackers.   cup unsweetened dry cereal.    cup hot cereal.   cup rice or pasta.    cup mashed potatoes or  of a large baked potato.  1 cup fresh fruit or one small piece of fruit.    cup canned or frozen fruit or fruit juice.  1 cup milk.   cup plain fat-free yogurt or  yogurt sweetened with artificial sweeteners.   cup cooked dried beans or starchy vegetable, such as peas, corn, or potatoes.  Decide the number of standard-size servings that you  will eat. Multiply that number of servings by 15 (the grams of carbohydrates in that serving). For example, if you eat 2 cups of strawberries, you will have eaten 2 servings and 30 g of carbohydrates (2 servings x 15 g = 30 g). For foods such as soups and casseroles, in which more than one food is mixed in, you will need to count the carbohydrates in each food that is included. EXAMPLE OF CARBOHYDRATE COUNTING Sample Dinner  3 oz chicken breast.   cup of brown rice.   cup of corn.  1 cup milk.   1 cup strawberries with sugar-free whipped topping.  Carbohydrate Calculation Step 1: Identify the foods that contain carbohydrates:   Rice.   Corn.   Milk.   Strawberries. Step 2:Calculate the number of servings eaten of each:   2 servings of rice.   1 serving of corn.   1 serving of milk.   1 serving of strawberries. Step 3: Multiply each of those number of servings by 15 g:   2 servings of rice x 15 g = 30 g.   1 serving of corn x 15 g = 15 g.   1 serving of milk x 15 g = 15 g.   1 serving of strawberries x 15 g = 15 g. Step 4: Add together all of the amounts to find the total grams of carbohydrates eaten: 30 g + 15 g + 15 g + 15 g = 75 g. Document Released: 09/02/2005 Document Revised: 01/17/2014 Document Reviewed: 07/30/2013 [facility] Patient Information 2015 [place], [place]. This information is not intended to replace advice given to you by your health care provider. Make sure you discuss any questions you have with your health care provider. DASH Eating Plan DASH stands for "Dietary Approaches to Stop Hypertension." The DASH eating plan is a healthy eating plan that has been shown to reduce high blood pressure (hypertension). Additional health benefits may include reducing the  risk of type 2 diabetes mellitus, heart disease, and stroke. The DASH eating plan may also help with weight loss. WHAT DO I NEED TO KNOW ABOUT THE DASH EATING PLAN? For the DASH eating plan, you will follow these general guidelines:  Choose foods with a percent daily value for sodium of less than 5% (as listed on the food label).  Use salt-free seasonings or herbs instead of table salt or sea salt.  Check with your health care provider or pharmacist before using salt substitutes.  Eat lower-sodium products, often labeled as "lower sodium" or "no salt added."  Eat fresh foods.  Eat more vegetables, fruits, and low-fat dairy products.  Choose whole grains. Look for the word "whole" as the first word in the ingredient list.  Choose fish and skinless chicken or [place] more often than red meat. Limit fish, poultry, and meat to 6 oz (170 g) each day.  Limit sweets, desserts, sugars, and sugary drinks.  Choose heart-healthy fats.  Limit cheese to 1 oz (28 g) per day.  Eat more home-cooked food and less restaurant, buffet, and fast food.  Limit fried foods.  Cook foods using methods other than frying.  Limit canned vegetables. If you do use them, rinse them well to decrease the sodium.  When eating at a restaurant, ask that your food be prepared with less salt, or no salt if possible. WHAT FOODS CAN I EAT? Seek help from a dietitian for individual calorie needs. Grains Whole grain or whole wheat bread. Brown rice. Whole grain or  whole wheat pasta. Quinoa, bulgur, and whole grain cereals. Low-sodium cereals. Corn or whole wheat flour tortillas. Whole grain cornbread. Whole grain crackers. Low-sodium crackers. Vegetables Fresh or frozen vegetables (raw, steamed, roasted, or grilled). Low-sodium or reduced-sodium tomato and vegetable juices. Low-sodium or reduced-sodium tomato sauce and paste. Low-sodium or reduced-sodium canned vegetables.  Fruits All fresh, canned (in natural juice),  or frozen fruits. Meat and Other Protein Products Ground beef (85% or leaner), grass-fed beef, or beef trimmed of fat. Skinless chicken or [place]. Ground chicken or [place]. Pork trimmed of fat. All fish and seafood. Eggs. Dried beans, peas, or lentils. Unsalted nuts and seeds. Unsalted canned beans. Dairy Low-fat dairy products, such as skim or 1% milk, 2% or reduced-fat cheeses, low-fat ricotta or cottage cheese, or plain low-fat yogurt. Low-sodium or reduced-sodium cheeses. Fats and Oils Tub margarines without trans fats. Light or reduced-fat mayonnaise and salad dressings (reduced sodium). Avocado. Safflower, olive, or canola oils. Natural peanut or almond butter. Other Unsalted popcorn and pretzels. The items listed above may not be a complete list of recommended foods or beverages. Contact your dietitian for more options. WHAT FOODS ARE NOT RECOMMENDED? Grains White bread. White pasta. White rice. Refined cornbread. Bagels and croissants. Crackers that contain trans fat. Vegetables Creamed or fried vegetables. Vegetables in a cheese sauce. Regular canned vegetables. Regular canned tomato sauce and paste. Regular tomato and vegetable juices. Fruits Dried fruits. Canned fruit in light or heavy syrup. Fruit juice. Meat and Other Protein Products Fatty cuts of meat. Ribs, chicken wings, bacon, sausage, bologna, salami, chitterlings, fatback, hot dogs, bratwurst, and packaged luncheon meats. Salted nuts and seeds. Canned beans with salt. Dairy Whole or 2% milk, cream, half-and-half, and cream cheese. Whole-fat or sweetened yogurt. Full-fat cheeses or blue cheese. Nondairy creamers and whipped toppings. Processed cheese, cheese spreads, or cheese curds. Condiments Onion and garlic salt, seasoned salt, table salt, and sea salt. Canned and packaged gravies. Worcestershire sauce. Tartar sauce. Barbecue sauce. Teriyaki sauce. Soy sauce, including reduced sodium. Steak sauce. Fish sauce. Oyster  sauce. Cocktail sauce. Horseradish. Ketchup and mustard. Meat flavorings and tenderizers. Bouillon cubes. Hot sauce. Tabasco sauce. Marinades. Taco seasonings. Relishes. Fats and Oils Butter, stick margarine, lard, shortening, ghee, and bacon fat. Coconut, palm kernel, or palm oils. Regular salad dressings. Other Pickles and olives. Salted popcorn and pretzels. The items listed above may not be a complete list of foods and beverages to avoid. Contact your dietitian for more information. WHERE CAN I FIND MORE INFORMATION? National Heart, Lung, and Blood Institute: travelstabloid.com Document Released: 08/22/2011 Document Revised: 01/17/2014 Document Reviewed: 07/07/2013 [facility] Patient Information 2015 [place], [place]. This information is not intended to replace advice given to you by your health care provider. Make sure you discuss any questions you have with your health care provider. Hypoglycemia Hypoglycemia occurs when the glucose in your blood is too low. Glucose is a type of sugar that is your body's main energy source. Hormones, such as insulin and glucagon, control the level of glucose in the blood. Insulin lowers blood glucose and glucagon increases blood glucose. Having too much insulin in your blood stream, or not eating enough food containing sugar, can result in hypoglycemia. Hypoglycemia can happen to people with or without diabetes. It can develop quickly and can be a medical emergency.  CAUSES   Missing or delaying meals.  Not eating enough carbohydrates at meals.  Taking too much diabetes medicine.  Not timing your oral diabetes medicine or insulin doses with meals, snacks, and  exercise.  Nausea and vomiting.  Certain medicines.  Severe illnesses, such as hepatitis, kidney disorders, and certain eating disorders.  Increased activity or exercise without eating something extra or adjusting medicines.  Drinking too much alcohol.  A nerve  disorder that affects body functions like your heart rate, blood pressure, and digestion (autonomic neuropathy).  A condition where the stomach muscles do not function properly (gastroparesis). Therefore, medicines and food may not absorb properly.  Rarely, a tumor of the pancreas can produce too much insulin. SYMPTOMS   Hunger.  Sweating (diaphoresis).  Change in body temperature.  Shakiness.  Headache.  Anxiety.  Lightheadedness.  Irritability.  Difficulty concentrating.  Dry mouth.  Tingling or numbness in the hands or feet.  Restless sleep or sleep disturbances.  Altered speech and coordination.  Change in mental status.  Seizures or prolonged convulsions.  Combativeness.  Drowsiness (lethargic).  Weakness.  Increased heart rate or palpitations.  Confusion.  Pale, gray skin color.  Blurred or double vision.  Fainting. DIAGNOSIS  A physical exam and medical history will be performed. Your caregiver may make a diagnosis based on your symptoms. Blood tests and other lab tests may be performed to confirm a diagnosis. Once the diagnosis is made, your caregiver will see if your signs and symptoms go away once your blood glucose is raised.  TREATMENT  Usually, you can easily treat your hypoglycemia when you notice symptoms.  Check your blood glucose. If it is less than 70 mg/dl, take one of the following:   3-4 glucose tablets.    cup juice.    cup regular soda.   1 cup skim milk.   -1 tube of glucose gel.   5-6 hard candies.   Avoid high-fat drinks or food that may delay a rise in blood glucose levels.  Do not take more than the recommended amount of sugary foods, drinks, gel, or tablets. Doing so will cause your blood glucose to go too high.   Wait 10-15 minutes and recheck your blood glucose. If it is still less than 70 mg/dl or below your target range, repeat treatment.   Eat a snack if it is more than 1 hour until your next  meal.  There may be a time when your blood glucose may go so low that you are unable to treat yourself at home when you start to notice symptoms. You may need someone to help you. You may even faint or be unable to swallow. If you cannot treat yourself, someone will need to bring you to the hospital.  [place]  If you have diabetes, follow your diabetes management plan by:  Taking your medicines as directed.  Following your exercise plan.  Following your meal plan. Do not skip meals. Eat on time.  Testing your blood glucose regularly. Check your blood glucose before and after exercise. If you exercise longer or different than usual, be sure to check blood glucose more frequently.  Wearing your medical alert jewelry that says you have diabetes.  Identify the cause of your hypoglycemia. Then, develop ways to prevent the recurrence of hypoglycemia.  Do not take a hot bath or shower right after an insulin shot.  Always carry treatment with you. Glucose tablets are the easiest to carry.  If you are going to drink alcohol, drink it only with meals.  Tell friends or family members ways to keep you safe during a seizure. This may include removing hard or sharp objects from the area or turning  you on your side.  Maintain a healthy weight. SEEK MEDICAL CARE IF:   You are having problems keeping your blood glucose in your target range.  You are having frequent episodes of hypoglycemia.  You feel you might be having side effects from your medicines.  You are not sure why your blood glucose is dropping so low.  You notice a change in vision or a new problem with your vision. SEEK IMMEDIATE MEDICAL CARE IF:   Confusion develops.  A change in mental status occurs.  The inability to swallow develops.  Fainting occurs. Document Released: 09/02/2005 Document Revised: 09/07/2013 Document Reviewed: 12/30/2011 [facility] Patient Information 2015 [place], [place]. This  information is not intended to replace advice given to you by your health care provider. Make sure you discuss any questions you have with your health care provider.

## 2015-03-02 ENCOUNTER — Telehealth: Payer: Self-pay | Admitting: General Practice

## 2015-03-02 NOTE — Telephone Encounter (Signed)
Pt requesting copy of letter for work that was printed on 02/08/15. Please f/u with pt when ready.

## 2015-03-06 ENCOUNTER — Ambulatory Visit: Payer: 59 | Attending: Internal Medicine | Admitting: Internal Medicine

## 2015-03-06 ENCOUNTER — Encounter: Payer: Self-pay | Admitting: Internal Medicine

## 2015-03-06 VITALS — BP 159/111 | HR 91 | Wt 313.2 lb

## 2015-03-06 DIAGNOSIS — E119 Type 2 diabetes mellitus without complications: Secondary | ICD-10-CM

## 2015-03-06 DIAGNOSIS — I10 Essential (primary) hypertension: Secondary | ICD-10-CM | POA: Diagnosis not present

## 2015-03-06 DIAGNOSIS — R059 Cough, unspecified: Secondary | ICD-10-CM

## 2015-03-06 DIAGNOSIS — R609 Edema, unspecified: Secondary | ICD-10-CM

## 2015-03-06 DIAGNOSIS — R040 Epistaxis: Secondary | ICD-10-CM

## 2015-03-06 DIAGNOSIS — Z029 Encounter for administrative examinations, unspecified: Secondary | ICD-10-CM

## 2015-03-06 DIAGNOSIS — R05 Cough: Secondary | ICD-10-CM

## 2015-03-06 LAB — LIPID PANEL
Cholesterol: 203 mg/dL — ABNORMAL HIGH (ref 0–200)
HDL: 50 mg/dL (ref >=46)
LDL Cholesterol: 126 mg/dL — ABNORMAL HIGH (ref 0–99)
Total CHOL/HDL Ratio: 4.1 Ratio
Triglycerides: 134 mg/dL (ref <150)
VLDL: 27 mg/dL (ref 0–40)

## 2015-03-06 LAB — GLUCOSE, POCT (MANUAL RESULT ENTRY): POC GLUCOSE: 151 mg/dL — AB (ref 70–99)

## 2015-03-06 MED ORDER — HYDROCHLOROTHIAZIDE 12.5 MG PO TABS: 12.5000 mg | ORAL_TABLET | Freq: Every day | ORAL | Status: DC

## 2015-03-06 NOTE — Progress Notes (Signed)
Patient ID: Virginia [name], female   DOB: [birth year], 50 y.o.   MRN: [id number]  CC: f/u  HPI: Virginia [name] is a 49 y.o. female here today for a follow up visit.  Patient has past medical history of HTN and diabetes mellitus. Patient was seen here on 5/25 as a new patient with Virginia Hurdle, NP. She was at that time started on Metformin 500 mg BID and Lantus 10 units, with directions to increase Lantus based on cbg readings. Today she reports that she is on 26 units of Lantus nightly. She checks her sugars twice daily with cbg readings of 77-229. She only has readings in the 200's when she has slipped on her diet. She is doing well with Metformin. She reports compliance with Norvasc but home readings have been 160-170/108. She is concerned about ankle swelling which she did not have when she was on HCTZ. She is currently concerned because she has continued to have a cough since discontinuing Lisinopril. The cough is worse at night and she often has epistaxis in the early mornings.     Patient has No headache, No chest pain, No abdominal pain - No Nausea, No new weakness tingling or numbness. No Known Allergies Past Medical History  Diagnosis Date  . Hypertension   . Diabetes mellitus without complication   . Thyroid disease   . Hyperlipemia    Current Outpatient Prescriptions on File Prior to Visit  Medication Sig Dispense Refill  . amLODipine (NORVASC) 10 MG tablet Take 1 tablet (10 mg total) by mouth daily. 30 tablet 2  . Blood Glucose Monitoring Suppl (TRUE METRIX METER) W/DEVICE KIT Use as directed 1 kit 11  . glucose blood (FREESTYLE LITE) test strip Use as instructed 100 each 12  . glucose blood (TRUE METRIX BLOOD GLUCOSE TEST) test strip Use as instructed 100 each 12  . Insulin Glargine (LANTUS SOLOSTAR) 100 UNIT/ML Solostar Pen Start with 10 units and increase by 2 units a day every 2 days if BS over 150. 5 pen PRN  . Insulin Pen Needle 32G X 6 MM MISC Inject 10 units daily at  bed time. Advance 2 units every 2 days until a fasting blood sugar of 130. 100 each 11  . meloxicam (MOBIC) 15 MG tablet Take 1 tablet (15 mg total) by mouth daily. 30 tablet 1  . TRUEPLUS LANCETS 26G MISC Use as directed! 100 each 11  . ibuprofen (ADVIL,MOTRIN) 400 MG tablet Take 400 mg by mouth every 8 (eight) hours as needed for mild pain.    . metFORMIN (GLUCOPHAGE) 500 MG tablet Take 1 tablet (500 mg total) by mouth 2 (two) times daily with a meal. (Patient not taking: Reported on 03/06/2015) 180 tablet 0   No current facility-administered medications on file prior to visit.   Family History  Problem Relation Age of Onset  . Heart disease Mother   . Hypertension Mother   . Stroke Father    History   Social History  . Marital Status: Divorced    Spouse Name: N/A  . Number of Children: N/A  . Years of Education: N/A   Occupational History  . Not on file.   Social History Main Topics  . Smoking status: Never Smoker   . Smokeless tobacco: Not on file  . Alcohol Use: No  . Drug Use: No  . Sexual Activity: Not on file   Other Topics Concern  . Not on file   Social History Narrative    Review  of Systems  Eyes: Positive for blurred vision.  Cardiovascular: Positive for leg swelling. Negative for chest pain and palpitations (improving).  Genitourinary: Positive for frequency (improving).  Neurological: Positive for headaches. Negative for dizziness and tingling.  Endo/Heme/Allergies: Positive for polydipsia (improving).  All other systems reviewed and are negative.   Objective:   Filed Vitals:   03/06/15 1111  BP: 159/111  Pulse: 91    Physical Exam  Constitutional: She is oriented to person, place, and time.  Cardiovascular: Normal rate, regular rhythm and normal heart sounds.   Pulmonary/Chest: Effort normal and breath sounds normal.  Musculoskeletal: She exhibits edema (BLE).  Neurological: She is alert and oriented to person, place, and time.  Skin: Skin is  warm and dry.  Psychiatric: She has a normal mood and affect.     Lab Results  Component Value Date   WBC 8.4 02/08/2015   HGB 13.9 02/08/2015   HCT 42.7 02/08/2015   MCV 92.8 02/08/2015   PLT 448* 02/08/2015   Lab Results  Component Value Date   CREATININE 1.01 02/08/2015   BUN 15 02/08/2015   NA 136 02/08/2015   K 4.6 02/08/2015   CL 101 02/08/2015   CO2 25 02/08/2015    Lab Results  Component Value Date   HGBA1C 11.2 02/08/2015   Lipid Panel  No results found for: CHOL, TRIG, HDL, CHOLHDL, VLDL, LDLCALC     Assessment and plan:   [name] was seen today for established care.  Diagnoses and all orders for this visit:  Type 2 diabetes mellitus without complication Orders: -     POCT glucose (manual entry) -     Microalbumin, urine -     Lipid panel -     Amb Referral to [facility] I have advised patient to stay at 26 units of Lantus for now and we will focus on diet modifications. I have sent her to nutrition to assist with diet changes and weight loss. Foot care, weight, cholesterol, risk factors discussed with patient  Essential hypertension Orders: -     Begin hydrochlorothiazide (HYDRODIURIL) 12.5 MG tablet; Take 1 tablet (12.5 mg total) by mouth daily. BP is still not controlled on Norvasc alone. I have added HCTZ to help with edema and BP. She will come back in 2 weeks for recheck. If at that time her BP is still >140/90 I will increase her HCTZ to 25 mg daily. I did not add losartan today due to wanting to control edema.  Edema See above  Morbid obesity Weight loss discussed at length and its complications to health.  Patient will loss 10 months by next visit in 3 months.  Diet and exercise discussed as well as calorie intake.  Epistaxis Asked patient to get humidifier to use at night. May use Saline nasal spray  Cough See above.   Encounter for administrative purpose FMLA papers filed out during visit. Forms given back to  patient.  Return in about 3 weeks (around 03/27/2015) for Nurse Visit-BP check and 3 mo PCP .       Chari Manning, [facility] [phone] 03/06/2015, 11:22 AM

## 2015-03-06 NOTE — Progress Notes (Signed)
  New patient here to discuss Hypertension. Pt stated she was started on Lisinopril for her blood pressure but she stop taking it because she develop a cough. She continues to have this coughing especially at night. She is currently taking a amlodipine.

## 2015-03-06 NOTE — Patient Instructions (Signed)
Diabetes Mellitus and Food It is important for you to manage your blood sugar (glucose) level. Your blood glucose level can be greatly affected by what you eat. Eating healthier foods in the appropriate amounts throughout the day at about the same time each day will help you control your blood glucose level. It can also help slow or prevent worsening of your diabetes mellitus. Healthy eating may even help you improve the level of your blood pressure and reach or maintain a healthy weight.  HOW CAN FOOD AFFECT ME? Carbohydrates Carbohydrates affect your blood glucose level more than any other type of food. Your dietitian will help you determine how many carbohydrates to eat at each meal and teach you how to count carbohydrates. Counting carbohydrates is important to keep your blood glucose at a healthy level, especially if you are using insulin or taking certain medicines for diabetes mellitus. Alcohol Alcohol can cause sudden decreases in blood glucose (hypoglycemia), especially if you use insulin or take certain medicines for diabetes mellitus. Hypoglycemia can be a life-threatening condition. Symptoms of hypoglycemia (sleepiness, dizziness, and disorientation) are similar to symptoms of having too much alcohol.  If your health care provider has given you approval to drink alcohol, do so in moderation and use the following guidelines:  Women should not have more than one drink per day, and men should not have more than two drinks per day. One drink is equal to:  12 oz of beer.  5 oz of wine.  1 oz of hard liquor.  Do not drink on an empty stomach.  Keep yourself hydrated. Have water, diet soda, or unsweetened iced tea.  Regular soda, juice, and other mixers might contain a lot of carbohydrates and should be counted. WHAT FOODS ARE NOT RECOMMENDED? As you make food choices, it is important to remember that all foods are not the same. Some foods have fewer nutrients per serving than other  foods, even though they might have the same number of calories or carbohydrates. It is difficult to get your body what it needs when you eat foods with fewer nutrients. Examples of foods that you should avoid that are high in calories and carbohydrates but low in nutrients include:  Trans fats (most processed foods list trans fats on the Nutrition Facts label).  Regular soda.  Juice.  Candy.  Sweets, such as cake, pie, doughnuts, and cookies.  Fried foods. WHAT FOODS CAN I EAT? Have nutrient-rich foods, which will nourish your body and keep you healthy. The food you should eat also will depend on several factors, including:  The calories you need.  The medicines you take.  Your weight.  Your blood glucose level.  Your blood pressure level.  Your cholesterol level. You also should eat a variety of foods, including:  Protein, such as meat, poultry, fish, tofu, nuts, and seeds (lean animal proteins are best).  Fruits.  Vegetables.  Dairy products, such as milk, cheese, and yogurt (low fat is best).  Breads, grains, pasta, cereal, rice, and beans.  Fats such as olive oil, trans fat-free margarine, canola oil, avocado, and olives. DOES EVERYONE WITH DIABETES MELLITUS HAVE THE SAME MEAL PLAN? Because every person with diabetes mellitus is different, there is not one meal plan that works for everyone. It is very important that you meet with a dietitian who will help you create a meal plan that is just right for you. Document Released: 05/30/2005 Document Revised: 09/07/2013 Document Reviewed: 07/30/2013 [employer] Patient Information 2015 [employer]. This   information is not intended to replace advice given to you by your health care provider. Make sure you discuss any questions you have with your health care provider.  

## 2015-03-07 LAB — MICROALBUMIN, URINE: Microalb, Ur: 1.5 mg/dL (ref <2.0)

## 2015-03-14 ENCOUNTER — Other Ambulatory Visit: Payer: Self-pay

## 2015-03-14 MED ORDER — ATORVASTATIN CALCIUM 10 MG PO TABS: 10.0000 mg | ORAL_TABLET | Freq: Every day | ORAL | Status: DC

## 2015-03-23 ENCOUNTER — Encounter: Payer: 59 | Attending: Internal Medicine

## 2015-03-23 VITALS — Ht 63.0 in | Wt 313.0 lb

## 2015-03-23 DIAGNOSIS — Z794 Long term (current) use of insulin: Secondary | ICD-10-CM | POA: Insufficient documentation

## 2015-03-23 DIAGNOSIS — Z713 Dietary counseling and surveillance: Secondary | ICD-10-CM | POA: Diagnosis not present

## 2015-03-23 DIAGNOSIS — E119 Type 2 diabetes mellitus without complications: Secondary | ICD-10-CM | POA: Diagnosis present

## 2015-03-23 NOTE — Progress Notes (Signed)

## 2015-03-24 ENCOUNTER — Telehealth: Payer: Self-pay | Admitting: Internal Medicine

## 2015-03-24 NOTE — Telephone Encounter (Signed)
Patient called requesting to speak to nurse regarding cough, pt states cough has not gotten better and OTC medication has not been helping her either. Please f/u with pt

## 2015-03-30 DIAGNOSIS — E119 Type 2 diabetes mellitus without complications: Secondary | ICD-10-CM

## 2015-03-30 NOTE — Progress Notes (Signed)

## 2015-03-31 ENCOUNTER — Ambulatory Visit: Payer: 59 | Attending: Internal Medicine | Admitting: *Deleted

## 2015-03-31 VITALS — BP 144/90 | HR 92 | Temp 98.2°F | Resp 18 | Ht 63.0 in | Wt 317.2 lb

## 2015-03-31 DIAGNOSIS — I10 Essential (primary) hypertension: Secondary | ICD-10-CM | POA: Diagnosis not present

## 2015-03-31 DIAGNOSIS — R05 Cough: Secondary | ICD-10-CM | POA: Insufficient documentation

## 2015-03-31 DIAGNOSIS — R059 Cough, unspecified: Secondary | ICD-10-CM

## 2015-03-31 MED ORDER — OMEPRAZOLE 20 MG PO CPDR: 20.0000 mg | DELAYED_RELEASE_CAPSULE | Freq: Every day | ORAL | Status: DC

## 2015-03-31 MED ORDER — BENZONATATE 100 MG PO CAPS: 100.0000 mg | ORAL_CAPSULE | Freq: Two times a day (BID) | ORAL | Status: DC | PRN

## 2015-03-31 MED ORDER — HYDROCHLOROTHIAZIDE 25 MG PO TABS: 25.0000 mg | ORAL_TABLET | Freq: Every day | ORAL | Status: DC

## 2015-03-31 NOTE — Progress Notes (Signed)
Patient presents for BP check after starting HCTZ 12.5 mg daily in addition to amlodipine 10 mg daily Med list reviewed; states taking all meds as directed Patient is not adding salt to foods or cooking with salt and using Mrs Deliah Boston as alternative to salt.  Walks around at work. On mandatory overtime right now but will resume going to the gym when this is finished Patient denies headaches, blurred vision, SHOB, chest pain  C/o dry cough. States improved with loratadine but still present causing stress incontinence and flatulence, awakens at night  Filed Vitals:   03/31/15 1511  BP: 144/90  Pulse: 92  Temp: 98.2 F (36.8 C)  Resp: 18     Per PCP: Increase HCTZ to 25 mg daily Try omeprazole 20 mg in AM. If no relief from cough try tessalon. Rxs e-scribed to [facility]  Patient to return in 2 weeks for nurse visit for BP check  Patient advised to call for med refills at least 7 days before running out so as not to go without.  Patient given literature on Cough and GERD and GERD Food Choices

## 2015-03-31 NOTE — Patient Instructions (Addendum)
Cough, Adult  A cough is a reflex that helps clear your throat and airways. It can help heal the body or may be a reaction to an irritated airway. A cough may only last 2 or 3 weeks (acute) or may last more than 8 weeks (chronic).  CAUSES Acute cough:  Viral or bacterial infections. Chronic cough:  Infections.  Allergies.  Asthma.  Post-nasal drip.  Smoking.  Heartburn or acid reflux.  Some medicines.  Chronic lung problems (COPD).  Cancer. SYMPTOMS   Cough.  Fever.  Chest pain.  Increased breathing rate.  High-pitched whistling sound when breathing (wheezing).  Colored mucus that you cough up (sputum). TREATMENT   A bacterial cough may be treated with antibiotic medicine.  A viral cough must run its course and will not respond to antibiotics.  Your caregiver may recommend other treatments if you have a chronic cough. HOME CARE INSTRUCTIONS   Only take over-the-counter or prescription medicines for pain, discomfort, or fever as directed by your caregiver. Use cough suppressants only as directed by your caregiver.  Use a cold steam vaporizer or humidifier in your bedroom or home to help loosen secretions.  Sleep in a semi-upright position if your cough is worse at night.  Rest as needed.  Stop smoking if you smoke. SEEK IMMEDIATE MEDICAL CARE IF:   You have pus in your sputum.  Your cough starts to worsen.  You cannot control your cough with suppressants and are losing sleep.  You begin coughing up blood.  You have difficulty breathing.  You develop pain which is getting worse or is uncontrolled with medicine.  You have a fever. MAKE SURE YOU:   Understand these instructions.  Will watch your condition.  Will get help right away if you are not doing well or get worse. Document Released: 03/01/2011 Document Revised: 11/25/2011 Document Reviewed: 03/01/2011 [facility] Patient Information 2015 [place], [place]. This information is not intended  to replace advice given to you by your health care provider. Make sure you discuss any questions you have with your health care provider. Gastroesophageal Reflux Disease, Adult Gastroesophageal reflux disease (GERD) happens when acid from your stomach flows up into the esophagus. When acid comes in contact with the esophagus, the acid causes soreness (inflammation) in the esophagus. Over time, GERD may create small holes (ulcers) in the lining of the esophagus. CAUSES   Increased body weight. This puts pressure on the stomach, making acid rise from the stomach into the esophagus.  Smoking. This increases acid production in the stomach.  Drinking alcohol. This causes decreased pressure in the lower esophageal sphincter (valve or ring of muscle between the esophagus and stomach), allowing acid from the stomach into the esophagus.  Late evening meals and a full stomach. This increases pressure and acid production in the stomach.  A malformed lower esophageal sphincter. Sometimes, no cause is found. SYMPTOMS   Burning pain in the lower part of the mid-chest behind the breastbone and in the mid-stomach area. This may occur twice a week or more often.  Trouble swallowing.  Sore throat.  Dry cough.  Asthma-like symptoms including chest tightness, shortness of breath, or wheezing. DIAGNOSIS  Your caregiver may be able to diagnose GERD based on your symptoms. In some cases, X-rays and other tests may be done to check for complications or to check the condition of your stomach and esophagus. TREATMENT  Your caregiver may recommend over-the-counter or prescription medicines to help decrease acid production. Ask your caregiver before starting or  adding any new medicines.  HOME CARE INSTRUCTIONS   Change the factors that you can control. Ask your caregiver for guidance concerning weight loss, quitting smoking, and alcohol consumption.  Avoid foods and drinks that make your symptoms worse, such  as:  Caffeine or alcoholic drinks.  Chocolate.  Peppermint or mint flavorings.  Garlic and onions.  Spicy foods.  Citrus fruits, such as oranges, lemons, or limes.  Tomato-based foods such as sauce, chili, salsa, and pizza.  Fried and fatty foods.  Avoid lying down for the 3 hours prior to your bedtime or prior to taking a nap.  Eat small, frequent meals instead of large meals.  Wear loose-fitting clothing. Do not wear anything tight around your waist that causes pressure on your stomach.  Raise the head of your bed 6 to 8 inches with wood blocks to help you sleep. Extra pillows will not help.  Only take over-the-counter or prescription medicines for pain, discomfort, or fever as directed by your caregiver.  Do not take aspirin, ibuprofen, or other nonsteroidal anti-inflammatory drugs (NSAIDs). SEEK IMMEDIATE MEDICAL CARE IF:   You have pain in your arms, neck, jaw, teeth, or back.  Your pain increases or changes in intensity or duration.  You develop nausea, vomiting, or sweating (diaphoresis).  You develop shortness of breath, or you faint.  Your vomit is green, yellow, black, or looks like coffee grounds or blood.  Your stool is red, bloody, or black. These symptoms could be signs of other problems, such as heart disease, gastric bleeding, or esophageal bleeding. MAKE SURE YOU:   Understand these instructions.  Will watch your condition.  Will get help right away if you are not doing well or get worse. Document Released: 06/12/2005 Document Revised: 11/25/2011 Document Reviewed: 03/22/2011 [facility] Patient Information 2015 [place], [place]. This information is not intended to replace advice given to you by your health care provider. Make sure you discuss any questions you have with your health care provider. Food Choices for Gastroesophageal Reflux Disease When you have gastroesophageal reflux disease (GERD), the foods you eat and your eating habits are very  important. Choosing the right foods can help ease the discomfort of GERD. WHAT GENERAL GUIDELINES DO I NEED TO FOLLOW?  Choose fruits, vegetables, whole grains, low-fat dairy products, and low-fat meat, fish, and poultry.  Limit fats such as oils, salad dressings, butter, nuts, and avocado.  Keep a food diary to identify foods that cause symptoms.  Avoid foods that cause reflux. These may be different for different people.  Eat frequent small meals instead of three large meals each day.  Eat your meals slowly, in a relaxed setting.  Limit fried foods.  Cook foods using methods other than frying.  Avoid drinking alcohol.  Avoid drinking large amounts of liquids with your meals.  Avoid bending over or lying down until 2-3 hours after eating. WHAT FOODS ARE NOT RECOMMENDED? The following are some foods and drinks that may worsen your symptoms: Vegetables Tomatoes. Tomato juice. Tomato and spaghetti sauce. Chili peppers. Onion and garlic. Horseradish. Fruits Oranges, grapefruit, and lemon (fruit and juice). Meats High-fat meats, fish, and poultry. This includes hot dogs, ribs, ham, sausage, salami, and bacon. Dairy Whole milk and chocolate milk. Sour cream. Cream. Butter. Ice cream. Cream cheese.  Beverages Coffee and tea, with or without caffeine. Carbonated beverages or energy drinks. Condiments Hot sauce. Barbecue sauce.  Sweets/Desserts Chocolate and cocoa. Donuts. Peppermint and spearmint. Fats and Oils High-fat foods, including [place] fries and potato  chips. Other Vinegar. Strong spices, such as black pepper, white pepper, red pepper, cayenne, curry powder, cloves, ginger, and chili powder. The items listed above may not be a complete list of foods and beverages to avoid. Contact your dietitian for more information. Document Released: 09/02/2005 Document Revised: 09/07/2013 Document Reviewed: 07/07/2013 [facility] Patient Information 2015 [place], [place]. This  information is not intended to replace advice given to you by your health care provider. Make sure you discuss any questions you have with your health care provider.

## 2015-04-06 DIAGNOSIS — E119 Type 2 diabetes mellitus without complications: Secondary | ICD-10-CM | POA: Diagnosis not present

## 2015-04-06 NOTE — Progress Notes (Signed)
Patient was seen on 04/06/2015 for the third of a series of three diabetes self-management courses at [facility]. The following learning objectives were met by the patient during this class:  . State the amount of activity recommended for healthy living . Describe activities suitable for individual needs . Identify ways to regularly incorporate activity into daily life . Identify barriers to activity and ways to over come these barriers  Identify diabetes medications being personally used and their primary action for lowering glucose and possible side effects . Describe role of stress on blood glucose and develop strategies to address psychosocial issues . Identify diabetes complications and ways to prevent them  Explain how to manage diabetes during illness . Evaluate success in meeting personal goal . Establish 2-3 goals that they will plan to diligently work on until they return for the  76-monthfollow-up visit  Goals:   I will count my carb choices at most meals and snacks  I will be active 15 minutes or more 3 times a week  I will take my diabetes medications as scheduled  I will eat less unhealthy fats by eating less sugary/salty/fatty foods  Your patient has identified these potential barriers to change:  finances Stress  Your patient has identified their diabetes self-care support plan as  [facility]:  Attend Core 4 in 4 months

## 2015-04-13 ENCOUNTER — Ambulatory Visit: Payer: 59 | Attending: Internal Medicine | Admitting: Pharmacist

## 2015-04-13 VITALS — BP 126/84

## 2015-04-13 DIAGNOSIS — I10 Essential (primary) hypertension: Secondary | ICD-10-CM

## 2015-04-13 NOTE — Patient Instructions (Addendum)
It was great to see you today Virginia [name]!  Your blood pressure was 126/84 so that is great! Keep taking all of your medications.

## 2015-04-13 NOTE — Progress Notes (Signed)
S:    She presents to the clinic for blood pressure evaluation.  Patient reports adherence to her medications. She reports that she has all of her medications and that she does not need any refills at this time.   Current BP Medications include:  Amlodipine 10 mg daily and HCTZ 25 mg daily (recently increased from 12.5 mg)  O:   Last 3 Office BP readings: BP Readings from Last 3 Encounters:  04/13/15 126/84  03/31/15 144/90  03/06/15 159/111   BMET    Component Value Date/Time   NA 136 02/08/2015 1057   K 4.6 02/08/2015 1057   CL 101 02/08/2015 1057   CO2 25 02/08/2015 1057   GLUCOSE 369* 02/08/2015 1057   BUN 15 02/08/2015 1057   CREATININE 1.01 02/08/2015 1057   CREATININE 0.75 02/07/2015 1422   CALCIUM 9.5 02/08/2015 1057   GFRNONAA 66 02/08/2015 1057   GFRNONAA >60 02/07/2015 1422   GFRAA 76 02/08/2015 1057   GFRAA >60 02/07/2015 1422    A/P: Hypertension: currently under improved control with HCTZ dose increase based on blood pressure reading of 126/84. Continue amlodipine 10 mg daily and HCTZ 25 mg daily. Medication reconciliation completed and medication list updated and now accurate. Blood pressure results reviewed and written information provided.   F/U Clinic Visit with Chari Manning as instructed. Total time in face-to-face counseling 15 minutes.

## 2015-06-08 ENCOUNTER — Ambulatory Visit (INDEPENDENT_AMBULATORY_CARE_PROVIDER_SITE_OTHER): Payer: 59 | Admitting: Family Medicine

## 2015-06-08 VITALS — BP 142/86 | HR 99 | Temp 98.7°F | Resp 18 | Ht 63.0 in | Wt 313.2 lb

## 2015-06-08 DIAGNOSIS — E119 Type 2 diabetes mellitus without complications: Secondary | ICD-10-CM | POA: Diagnosis not present

## 2015-06-08 DIAGNOSIS — L03116 Cellulitis of left lower limb: Secondary | ICD-10-CM | POA: Diagnosis not present

## 2015-06-08 MED ORDER — DOXYCYCLINE HYCLATE 100 MG PO TABS: 100.0000 mg | ORAL_TABLET | Freq: Two times a day (BID) | ORAL | Status: DC

## 2015-06-08 NOTE — Progress Notes (Addendum)
Subjective:  This chart was scribed for Virginia Parish, MD by Leandra Kern, Medical Scribe. This patient was seen in Room 9 and the patient's care was started at 8:26 AM.   Patient ID: Virginia [name], female    DOB: 09/11/1966, 49 y.o.   MRN: [id number]  Chief Complaint  Patient presents with  . possible abscess    left inner thigh  first noticed it tuesday    HPI HPI Comments: Virginia [name] is a 49 y.o. female who presents to [facility] complaining of a possible abscess on the left inner thigh, onset two days ago. Pt has a hx of diabetes. Diabetes is uncontrolled when last checked in May of this year. PCP is Lance Bosch, NP. Blood sugar was improving as of 06/20th. Pt reports that her blood sugar has been running high. She indicates that it was about 220 yesterday.   Today pt reports that about two days ago the [name] looked like a mosquito bite, however the [name] became harder and sorer. She states that the [name] looked puffier yesterday compared to today. She notes that she wiped the [name] with alcohol, and used Neosporin for the symptoms. She Pt denies fever, or chills. She does not indicated having a previous MRSA infection, or been hospitalized recently.   Patient Active Problem List   Diagnosis Date Noted  . Diabetes 02/08/2015  . Essential hypertension 02/08/2015   Past Medical History  Diagnosis Date  . Hypertension   . Diabetes mellitus without complication   . Thyroid disease   . Hyperlipemia    Past Surgical History  Procedure Laterality Date  . Abdominal hysterectomy     No Known Allergies Prior to Admission medications   Medication Sig Start Date End Date Taking? Authorizing Provider  amLODipine (NORVASC) 10 MG tablet Take 1 tablet (10 mg total) by mouth daily. 02/22/15  Yes Lance Bosch, NP  atorvastatin (LIPITOR) 10 MG tablet Take 1 tablet (10 mg total) by mouth daily. 03/14/15  Yes Lance Bosch, NP  benzonatate (TESSALON) 100 MG  capsule Take 1 capsule (100 mg total) by mouth 2 (two) times daily as needed for cough. 03/31/15  Yes Lance Bosch, NP  Blood Glucose Monitoring Suppl (TRUE METRIX METER) W/DEVICE KIT Use as directed 02/08/15  Yes Micheline Chapman, NP  glucose blood (TRUE METRIX BLOOD GLUCOSE TEST) test strip Use as instructed 02/08/15  Yes Micheline Chapman, NP  hydrochlorothiazide (HYDRODIURIL) 25 MG tablet Take 1 tablet (25 mg total) by mouth daily. 03/31/15  Yes Lance Bosch, NP  ibuprofen (ADVIL,MOTRIN) 400 MG tablet Take 400 mg by mouth every 8 (eight) hours as needed for mild pain.   Yes Historical Provider, MD  Insulin Glargine (LANTUS SOLOSTAR) 100 UNIT/ML Solostar Pen Start with 10 units and increase by 2 units a day every 2 days if BS over 150. 02/08/15  Yes Micheline Chapman, NP  Insulin Pen Needle 32G X 6 MM MISC Inject 10 units daily at bed time. Advance 2 units every 2 days until a fasting blood sugar of 130. 02/08/15  Yes Micheline Chapman, NP  metFORMIN (GLUCOPHAGE) 500 MG tablet Take 1 tablet (500 mg total) by mouth 2 (two) times daily with a meal. 02/08/15  Yes Micheline Chapman, NP  omeprazole (PRILOSEC) 20 MG capsule Take 1 capsule (20 mg total) by mouth daily. 03/31/15  Yes Lance Bosch, NP  TRUEPLUS LANCETS 26G MISC Use as directed!  02/08/15  Yes Micheline Chapman, NP   Social History   Social History  . Marital Status: Divorced    Spouse Name: N/A  . Number of Children: N/A  . Years of Education: N/A   Occupational History  . Not on file.   Social History Main Topics  . Smoking status: Never Smoker   . Smokeless tobacco: Not on file  . Alcohol Use: No  . Drug Use: No  . Sexual Activity: Not on file   Other Topics Concern  . Not on file   Social History Narrative    Review of Systems  Constitutional: Negative for fever and chills.  Skin: Positive for color change and pallor.      Objective:   Physical Exam  Constitutional: She is oriented to person, place, and time. She  appears well-developed and well-nourished. No distress.  HENT:  Head: Normocephalic and atraumatic.  Eyes: EOM are normal. Pupils are equal, round, and reactive to light.  Neck: Neck supple.  Cardiovascular: Normal rate.   Pulmonary/Chest: Effort normal.  Neurological: She is alert and oriented to person, place, and time. No cranial nerve deficit.  Skin: Skin is warm and dry.  Left medial thigh, there is an [name] of induration erythema. The induration is approximately 6X4 cm. there is some warmth in the [name] along with the erythema. There is very small pustule centrally, unroofed with cotton swap for culture. There is no underlying discharge or bleeding. There is no central fluctuance.  Psychiatric: She has a normal mood and affect. Her behavior is normal.  Nursing note and vitals reviewed.  Filed Vitals:   06/08/15 0820  BP: 142/86  Pulse: 99  Temp: 98.7 F (37.1 C)  TempSrc: Oral  Resp: 18  Height: '5\' 3"'  (1.6 m)  Weight: 313 lb 3.2 oz (142.067 kg)  SpO2: 98%      Assessment & Plan:    By signing my name below, I, Rawaa Al Rifaie, attest that this documentation has been prepared under the direction and in the presence of Virginia Parish, MD.  Leandra Kern, Medical Scribe. 06/08/2015.  8:26 AM.  Virginia [name] is a 49 y.o. female Cellulitis of leg, left - Plan: Wound culture, doxycycline (VIBRA-TABS) 100 MG tablet  Type 2 diabetes mellitus without complication  Left medial thigh cellulitis, culture obtained of central tiny pustule. No fluctuance, no other discharge. Deferred I&D at this time. Warm compresses frequently, start doxycycline, return to clinic precautions. Also advised her to call her primary care provider to work on her diabetes control as discussed wound care and healing with uncontrolled diabetes. Follow-up in the next few days if increased induration or central fluctuance, sooner if worse.  Meds ordered this encounter  Medications  . doxycycline  (VIBRA-TABS) 100 MG tablet    Sig: Take 1 tablet (100 mg total) by mouth 2 (two) times daily.    Dispense:  20 tablet    Refill:  0   Patient Instructions  Start doxycycline 1 pill twice per day. Apply warm compresses at least 4-5 times per day. If swelling persists, or any soft areas in the center of the hard swollen areas, return for recheck in the next few days. Call your primary provider to schedule follow-up for diabetes, and check your blood sugars while treating this infection. Wound care is improved with improved diabetes control, so would want to make sure to try for improved blood sugar control.   Return to the clinic or go to the  nearest emergency room if any of your symptoms worsen or new symptoms occur.   Cellulitis Cellulitis is an infection of the skin and the tissue beneath it. The infected [name] is usually red and tender. Cellulitis occurs most often in the arms and lower legs.  CAUSES  Cellulitis is caused by bacteria that enter the skin through cracks or cuts in the skin. The most common types of bacteria that cause cellulitis are staphylococci and streptococci. SIGNS AND SYMPTOMS   Redness and warmth.  Swelling.  Tenderness or pain.  Fever. DIAGNOSIS  Your health care provider can usually determine what is wrong based on a physical exam. Blood tests may also be done. TREATMENT  Treatment usually involves taking an antibiotic medicine. HOME CARE INSTRUCTIONS   Take your antibiotic medicine as directed by your health care provider. Finish the antibiotic even if you start to feel better.  Keep the infected arm or leg elevated to reduce swelling.  Apply a warm cloth to the affected [name] up to 4 times per day to relieve pain.  Take medicines only as directed by your health care provider.  Keep all follow-up visits as directed by your health care provider. SEEK MEDICAL CARE IF:   You notice red streaks coming from the infected [name].  Your red [name] gets larger  or turns dark in color.  Your bone or joint underneath the infected [name] becomes painful after the skin has healed.  Your infection returns in the same [name] or another [name].  You notice a swollen bump in the infected [name].  You develop new symptoms.  You have a fever. SEEK IMMEDIATE MEDICAL CARE IF:   You feel very sleepy.  You develop vomiting or diarrhea.  You have a general ill feeling (malaise) with muscle aches and pains. MAKE SURE YOU:   Understand these instructions.  Will watch your condition.  Will get help right away if you are not doing well or get worse. Document Released: 06/12/2005 Document Revised: 01/17/2014 Document Reviewed: 11/18/2011 [facility] Patient Information 2015 [place], [place]. This information is not intended to replace advice given to you by your health care provider. Make sure you discuss any questions you have with your health care provider.      I personally performed the services described in this documentation, which was scribed in my presence. The recorded information has been reviewed and considered, and addended by me as needed.

## 2015-06-08 NOTE — Patient Instructions (Signed)
Start doxycycline 1 pill twice per day. Apply warm compresses at least 4-5 times per day. If swelling persists, or any soft areas in the center of the hard swollen areas, return for recheck in the next few days. Call your primary provider to schedule follow-up for diabetes, and check your blood sugars while treating this infection. Wound care is improved with improved diabetes control, so would want to make sure to try for improved blood sugar control.   Return to the clinic or go to the nearest emergency room if any of your symptoms worsen or new symptoms occur.   Cellulitis Cellulitis is an infection of the skin and the tissue beneath it. The infected area is usually red and tender. Cellulitis occurs most often in the arms and lower legs.  CAUSES  Cellulitis is caused by bacteria that enter the skin through cracks or cuts in the skin. The most common types of bacteria that cause cellulitis are staphylococci and streptococci. SIGNS AND SYMPTOMS   Redness and warmth.  Swelling.  Tenderness or pain.  Fever. DIAGNOSIS  Your health care provider can usually determine what is wrong based on a physical exam. Blood tests may also be done. TREATMENT  Treatment usually involves taking an antibiotic medicine. HOME CARE INSTRUCTIONS   Take your antibiotic medicine as directed by your health care provider. Finish the antibiotic even if you start to feel better.  Keep the infected arm or leg elevated to reduce swelling.  Apply a warm cloth to the affected area up to 4 times per day to relieve pain.  Take medicines only as directed by your health care provider.  Keep all follow-up visits as directed by your health care provider. SEEK MEDICAL CARE IF:   You notice red streaks coming from the infected area.  Your red area gets larger or turns dark in color.  Your bone or joint underneath the infected area becomes painful after the skin has healed.  Your infection returns in the same area or  another area.  You notice a swollen bump in the infected area.  You develop new symptoms.  You have a fever. SEEK IMMEDIATE MEDICAL CARE IF:   You feel very sleepy.  You develop vomiting or diarrhea.  You have a general ill feeling (malaise) with muscle aches and pains. MAKE SURE YOU:   Understand these instructions.  Will watch your condition.  Will get help right away if you are not doing well or get worse. Document Released: 06/12/2005 Document Revised: 01/17/2014 Document Reviewed: 11/18/2011 [facility] Patient Information 2015 [place], [place]. This information is not intended to replace advice given to you by your health care provider. Make sure you discuss any questions you have with your health care provider.

## 2015-06-10 LAB — WOUND CULTURE
Gram Stain: NONE SEEN
Gram Stain: NONE SEEN

## 2015-11-03 ENCOUNTER — Ambulatory Visit (INDEPENDENT_AMBULATORY_CARE_PROVIDER_SITE_OTHER): Payer: 59

## 2015-11-03 ENCOUNTER — Ambulatory Visit (INDEPENDENT_AMBULATORY_CARE_PROVIDER_SITE_OTHER): Payer: 59 | Admitting: Podiatry

## 2015-11-03 ENCOUNTER — Encounter: Payer: Self-pay | Admitting: Podiatry

## 2015-11-03 VITALS — BP 172/110 | HR 80 | Resp 16 | Ht 63.0 in | Wt 315.0 lb

## 2015-11-03 DIAGNOSIS — M79672 Pain in left foot: Secondary | ICD-10-CM | POA: Diagnosis not present

## 2015-11-03 DIAGNOSIS — M722 Plantar fascial fibromatosis: Secondary | ICD-10-CM

## 2015-11-03 MED ORDER — TRIAMCINOLONE ACETONIDE 10 MG/ML IJ SUSP
10.0000 mg | Freq: Once | INTRAMUSCULAR | Status: AC
  Administered 2015-11-03: 10 mg

## 2015-11-03 NOTE — Patient Instructions (Signed)

## 2015-11-03 NOTE — Progress Notes (Signed)
   Subjective:    Patient ID: Virginia [name], female    DOB: 1965/09/25, 50 y.o.   MRN: [id number]  HPI Patient presents with foot pain in their left foot; heel and medial side; x2 weeks  Pt diabetic type 2; sugar=did not take today; A1C=does not know   Review of Systems     Objective:   Physical Exam        Assessment & Plan:

## 2015-11-07 NOTE — Progress Notes (Signed)
Subjective:     Patient ID: Virginia [name], female   DOB: Jul 06, 1966, 50 y.o.   MRN: [id number]  HPI patient presents stating she's developed a lot of pain in the bottom of her left heel for the last few weeks and does not remember specific injury   Review of Systems     Objective:   Physical Exam  neurovascular status intact muscle strength adequate range of motion within normal limits with patient found to have exquisite discomfort in the plantar heel left at the insertional point tendon the calcaneus with fluid buildup noted around the medial band. Good digital perfusion and patient is well oriented 3    Assessment:      acute plantar fasciitis left with fluid buildup noted    Plan:      H&P and x-rays reviewed and at this time went ahead and injected the plantar fascia 3 Milligan Kenalog 5 g Xylocaine and applied fascial brace instructed on physical therapy and reappoint to recheck in the next 2 weeks   Multiple views taken left indicated small spur with no indications of stress fracture arthritis

## 2015-11-09 ENCOUNTER — Ambulatory Visit: Payer: 59 | Admitting: Podiatry

## 2015-11-10 ENCOUNTER — Ambulatory Visit (INDEPENDENT_AMBULATORY_CARE_PROVIDER_SITE_OTHER): Payer: 59 | Admitting: Podiatry

## 2015-11-10 ENCOUNTER — Encounter: Payer: Self-pay | Admitting: Podiatry

## 2015-11-10 VITALS — Resp 16

## 2015-11-10 DIAGNOSIS — M722 Plantar fascial fibromatosis: Secondary | ICD-10-CM

## 2015-11-10 MED ORDER — TRIAMCINOLONE ACETONIDE 10 MG/ML IJ SUSP
10.0000 mg | Freq: Once | INTRAMUSCULAR | Status: AC
Start: 2015-11-10 — End: 2015-11-10
  Administered 2015-11-10: 10 mg

## 2015-11-12 NOTE — Progress Notes (Signed)
Subjective:     Patient ID: Virginia [name], female   DOB: March 15, 1966, 50 y.o.   MRN: [id number]  HPI patient states I'm quite a bit improved but I still have one area that's quite tender with pressure   Review of Systems     Objective:   Physical Exam Neurovascular status intact with continued discomfort plantar aspect left heel at the insertional point tendon into the calcaneus    Assessment:     Plantar fasciitis left with inflammation fluid buildup    Plan:     Reinjected the plantar fascia 3 mg Kenalog 5 mg Xylocaine and instructed on physical therapy. Reappoint as needed

## 2015-12-08 ENCOUNTER — Ambulatory Visit: Payer: 59 | Admitting: Podiatry

## 2017-08-08 ENCOUNTER — Other Ambulatory Visit: Payer: Self-pay

## 2017-08-08 ENCOUNTER — Encounter: Payer: Self-pay | Admitting: Physician Assistant

## 2017-08-08 ENCOUNTER — Ambulatory Visit: Payer: 59 | Admitting: Physician Assistant

## 2017-08-08 VITALS — BP 174/130 | HR 102 | Temp 98.6°F | Resp 18 | Ht 63.0 in | Wt 299.0 lb

## 2017-08-08 DIAGNOSIS — E119 Type 2 diabetes mellitus without complications: Secondary | ICD-10-CM

## 2017-08-08 DIAGNOSIS — Z1231 Encounter for screening mammogram for malignant neoplasm of breast: Secondary | ICD-10-CM | POA: Diagnosis not present

## 2017-08-08 DIAGNOSIS — Z794 Long term (current) use of insulin: Secondary | ICD-10-CM | POA: Diagnosis not present

## 2017-08-08 DIAGNOSIS — E785 Hyperlipidemia, unspecified: Secondary | ICD-10-CM | POA: Diagnosis not present

## 2017-08-08 DIAGNOSIS — K219 Gastro-esophageal reflux disease without esophagitis: Secondary | ICD-10-CM

## 2017-08-08 DIAGNOSIS — Z1211 Encounter for screening for malignant neoplasm of colon: Secondary | ICD-10-CM

## 2017-08-08 DIAGNOSIS — I10 Essential (primary) hypertension: Secondary | ICD-10-CM

## 2017-08-08 DIAGNOSIS — Z1239 Encounter for other screening for malignant neoplasm of breast: Secondary | ICD-10-CM

## 2017-08-08 LAB — POCT URINALYSIS DIP (MANUAL ENTRY)
Bilirubin, UA: NEGATIVE
Blood, UA: NEGATIVE
Glucose, UA: 500 mg/dL — AB
Ketones, POC UA: NEGATIVE mg/dL
Nitrite, UA: NEGATIVE
Protein Ur, POC: NEGATIVE mg/dL
Spec Grav, UA: 1.015 (ref 1.010–1.025)
Urobilinogen, UA: 1 U/dL
pH, UA: 6.5 (ref 5.0–8.0)

## 2017-08-08 MED ORDER — ATORVASTATIN CALCIUM 10 MG PO TABS: 10.0000 mg | ORAL_TABLET | Freq: Every day | ORAL | 2 refills | Status: DC

## 2017-08-08 MED ORDER — METFORMIN HCL 500 MG PO TABS: 500.0000 mg | ORAL_TABLET | Freq: Two times a day (BID) | ORAL | 0 refills | Status: DC

## 2017-08-08 MED ORDER — AMLODIPINE BESYLATE 10 MG PO TABS: 10.0000 mg | ORAL_TABLET | Freq: Every day | ORAL | 2 refills | Status: DC

## 2017-08-08 MED ORDER — OMEPRAZOLE 20 MG PO CPDR: 20.0000 mg | DELAYED_RELEASE_CAPSULE | Freq: Every day | ORAL | 3 refills | Status: DC

## 2017-08-08 MED ORDER — INSULIN PEN NEEDLE 32G X 6 MM MISC: 11 refills | Status: AC

## 2017-08-08 MED ORDER — INSULIN GLARGINE 100 UNIT/ML SOLOSTAR PEN: PEN_INJECTOR | SUBCUTANEOUS | 99 refills | Status: DC

## 2017-08-08 MED ORDER — HYDROCHLOROTHIAZIDE 25 MG PO TABS: 25.0000 mg | ORAL_TABLET | Freq: Every day | ORAL | 2 refills | Status: DC

## 2017-08-08 NOTE — Progress Notes (Signed)
Virginia [name]  MRN: [id number] DOB: [birth year]  PCP: Patient, No Pcp Per  Subjective:  Pt is a 51 year old female PMH HTN, HLD, DM and obesity who presents to clinic for medication refill.   She has not had any of her medications in >1 year. She has been taking care of her daughter and did not have the funds for her own medical care.  Recently went in for annual eye exam and the clinician told her "she saw blood in the back of my eyes". This made her nervous which led her back for medication refills. She is asymptomatic today. Denies headache, chest pain, vision changes. Endorses palpitations a few times a month, lasting a minute or so.  She has been evaluated in the ED for this in the past, negative work-up.   HTN - Today's blood pressure is 174/130. Norvasc 68m and HCTZ 261m  Last OV for HTN 03/2015. Last dose of medications >1 year ago. She has a home blood pressure kit, but has not used it > 6 months.   HLD - Atorvastatin 1070md. Last dose > 1 year ago. she is not eating healthy diet and she is not exercising regularly. Occasionally goes to the gym with her daughter and walks on treadmill or does elliptical.   Dm - Metformin 1,00 mg daily and Lantus. she does not check her sugars at home. Cannot recall past A1C levels.   Several care gaps including colonoscopy and MM.  Review of Systems  Constitutional: Negative for chills, diaphoresis, fatigue and fever.  Respiratory: Negative for cough, chest tightness, shortness of breath and wheezing.   Cardiovascular: Positive for palpitations. Negative for chest pain.  Gastrointestinal: Negative for abdominal pain, diarrhea, nausea and vomiting.  Endocrine: Negative for polydipsia, polyphagia and polyuria.  Neurological: Negative for weakness, light-headedness and headaches.    Patient Active Problem List   Diagnosis Date Noted  . Diabetes ([place]5/25/2016  . Essential hypertension 02/08/2015    Current Outpatient Medications on  File Prior to Visit  Medication Sig Dispense Refill  . amLODipine (NORVASC) 10 MG tablet Take 1 tablet (10 mg total) by mouth daily. (Patient not taking: Reported on 08/08/2017) 30 tablet 2  . atorvastatin (LIPITOR) 10 MG tablet Take 1 tablet (10 mg total) by mouth daily. (Patient not taking: Reported on 08/08/2017) 30 tablet 2  . benzonatate (TESSALON) 100 MG capsule Take 1 capsule (100 mg total) by mouth 2 (two) times daily as needed for cough. (Patient not taking: Reported on 08/08/2017) 20 capsule 0  . Blood Glucose Monitoring Suppl (TRUE METRIX METER) W/DEVICE KIT Use as directed (Patient not taking: Reported on 08/08/2017) 1 kit 11  . doxycycline (VIBRA-TABS) 100 MG tablet Take 1 tablet (100 mg total) by mouth 2 (two) times daily. (Patient not taking: Reported on 08/08/2017) 20 tablet 0  . glucose blood (TRUE METRIX BLOOD GLUCOSE TEST) test strip Use as instructed (Patient not taking: Reported on 08/08/2017) 100 each 12  . hydrochlorothiazide (HYDRODIURIL) 25 MG tablet Take 1 tablet (25 mg total) by mouth daily. (Patient not taking: Reported on 08/08/2017) 30 tablet 2  . ibuprofen (ADVIL,MOTRIN) 400 MG tablet Take 400 mg by mouth every 8 (eight) hours as needed for mild pain.    . Insulin Glargine (LANTUS SOLOSTAR) 100 UNIT/ML Solostar Pen Start with 10 units and increase by 2 units a day every 2 days if BS over 150. (Patient not taking: Reported on 08/08/2017) 5 pen PRN  . Insulin Pen Needle  32G X 6 MM MISC Inject 10 units daily at bed time. Advance 2 units every 2 days until a fasting blood sugar of 130. (Patient not taking: Reported on 08/08/2017) 100 each 11  . metFORMIN (GLUCOPHAGE) 500 MG tablet Take 1 tablet (500 mg total) by mouth 2 (two) times daily with a meal. (Patient not taking: Reported on 08/08/2017) 180 tablet 0  . omeprazole (PRILOSEC) 20 MG capsule Take 1 capsule (20 mg total) by mouth daily. (Patient not taking: Reported on 08/08/2017) 30 capsule 3  . TRUEPLUS LANCETS 26G MISC  Use as directed! (Patient not taking: Reported on 08/08/2017) 100 each 11   No current facility-administered medications on file prior to visit.     No Known Allergies   Objective:  BP (!) 180/138 (BP Location: Right Arm, Patient Position: Sitting, Cuff Size: Large)   Pulse (!) 102   Temp 98.6 F (37 C) (Oral)   Resp 18   Ht '5\' 3"'  (1.6 m)   Wt 299 lb (135.6 kg)   SpO2 98%   BMI 52.97 kg/m  Diabetic Foot Exam - Simple   Simple Foot Form Diabetic Foot exam was performed with the following findings:  Yes 08/08/2017  4:07 PM  Visual Inspection No deformities, no ulcerations, no other skin breakdown bilaterally:  Yes Sensation Testing Intact to touch and monofilament testing bilaterally:  Yes Pulse Check Posterior Tibialis and Dorsalis pulse intact bilaterally:  Yes Comments     Physical Exam  Constitutional: She is oriented to person, place, and time and well-developed, well-nourished, and in no distress. No distress.  obese  Cardiovascular: Normal rate, regular rhythm and normal heart sounds.  Pulmonary/Chest: Effort normal and breath sounds normal.  Musculoskeletal:       Right ankle: She exhibits swelling.       Left ankle: She exhibits swelling.       Right lower leg: She exhibits edema.       Left lower leg: She exhibits edema.  Neurological: She is alert and oriented to person, place, and time. GCS score is 15.  Skin: Skin is warm and dry.  Psychiatric: Mood, memory, affect and judgment normal.  Vitals reviewed.   Assessment and Plan :  1. Essential hypertension - POCT urinalysis dipstick - amLODipine (NORVASC) 10 MG tablet; Take 1 tablet (10 mg total) by mouth daily.  Dispense: 30 tablet; Refill: 2 - hydrochlorothiazide (HYDRODIURIL) 25 MG tablet; Take 1 tablet (25 mg total) by mouth daily.  Dispense: 30 tablet; Refill: 2 - Pt presents for medication refill s/p +finding on annual eye exam. She is asymptomatic today.  It appears she has a habit of medication non  compliance then establishing care at a new facility for refills. Today's blood pressure is 174/130. Routine labs pending. Discussed importance of lifestyle changes. She has seen DM nutritionist in the past. Medication titration discussed. Advised her to take home blood pressures and check sugars.  RTC in 3-4 weeks for recheck.  2. Type 2 diabetes mellitus without complication, with long-term current use of insulin (HCC) - Hemoglobin A1c - CMP14+EGFR - Microalbumin, urine - HM Diabetes Foot Exam - Insulin Pen Needle 32G X 6 MM MISC; Inject 10 units daily at bed time. Advance 2 units every 2 days until a fasting blood sugar of 130.  Dispense: 100 each; Refill: 11 - metFORMIN (GLUCOPHAGE) 500 MG tablet; Take 1 tablet (500 mg total) by mouth 2 (two) times daily with a meal.  Dispense: 180 tablet; Refill: 0 - Insulin  Glargine (LANTUS SOLOSTAR) 100 UNIT/ML Solostar Pen; Start with 10 units and increase by 2 units a day every 2 days if BS over 150.  Dispense: 5 pen; Refill: PRN  3. Hyperlipidemia, unspecified hyperlipidemia type - Lipid panel - atorvastatin (LIPITOR) 10 MG tablet; Take 1 tablet (10 mg total) by mouth daily.  Dispense: 30 tablet; Refill: 2  4. Gastroesophageal reflux disease, esophagitis presence not specified - omeprazole (PRILOSEC) 20 MG capsule; Take 1 capsule (20 mg total) by mouth daily.  Dispense: 30 capsule; Refill: 3  5. Screen for colon cancer - Ambulatory referral to Gastroenterology  6. Screening for breast cancer - MM Digital Screening; Future   Mercer Pod, PA-C  [facility] 08/08/2017 3:10 PM

## 2017-08-08 NOTE — Patient Instructions (Addendum)
You need to titrated Metformin back up to your original dose from a low dose in order to reduce abdominal side effects.  Metformin Dosing (to be taken with food) Week 1: take 1/2 tablet twice a day. Week 2: take 1 tablet in the morning, 1/2 tablet at night. Week 3: take 2 tablets twice a day.  Come back and see me in 3-4 weeks for a recheck. Work hard on [employer] (see below) and eating healthy foods.  You will receive a phone call to schedule an appointment for colonoscopy and mammogram.   Try to shop mostly along the perimeter of the grocery store. Cut down consumption of processed foods.   The following foods are the foundation of a heart-healthy diet: Vegetables such as greens (spinach, collard greens, kale), broccoli, cabbage, carrots, bell peppers; stay away from starchy vegetables like potatoes, carrots, peas Fruits such as avocados, apples, berries, bananas, oranges, pears, grapes, and prunes  Whole grains such as plain oatmeal, brown rice, and whole-grain bread or tortillas  Fat-free or low-fat dairy foods such as milk, cheese, or yogurt  Protein-rich foods:  Fish high in omega-3 fatty acids, such as salmon, tuna, and trout, about 8 ounces a week  Lean meats such as 95 percent lean ground beef or pork tenderloin  Poultry such as skinless chicken or [place]  Eggs  Nuts, seeds, and soy products: quinoa, chia seeds Legumes such as kidney beans, lentils, chickpeas, black-eyed peas, and lima beans Oils and foods containing high levels of monounsaturated and polyunsaturated fats that can help lower blood cholesterol levels and the risk of cardiovascular disease. Some sources of these oils are:  Canola, corn, olive, safflower, sesame, sunflower, and soybean oils  Nuts such as walnuts, almonds, and pine nuts  Nut and seed butters  Salmon and trout  Seeds such as sesame, sunflower, pumpkin, or flax  Avocados  Tofu  Brussel sprouts - Cut off stems. Place in a mixing bowl that has a  lid. Pour in a 1/4-1/2 cup olive oil, spices, use a light amount of parmesan. Place on a baking sheet. Bake for 10 minutes at 400F. Take it out, eat the brussel chips. Place for another 5-10 minutes.   Mashed cauliflower - Boil a bunch of cauliflower in a pot of water. Blend in a food processor with 1-2 tablespoons of butter.  Spaghetti squash -  Cut the squash in half very carefully, clean out seeds from the middle. Place 1/2 face down in a microwave safe dish with at least 2 inches of water. Make 4-6 slits on outside of spaghetti squash and microwave for 10-12 minutes. Take out the spaghetti using a metal spoon. Repeat for the other half.   Vega protein is good protein powder, make sure you use ~6 ice cubes to give it smoothie consistency together with ~4-6 ounces of vanilla soy milk. Throw cinnamon into your shake, use peanut butter. You can also use the fruits listed above. Throw spinach or kale into the shake.   Recipe ideas: [employer], [employer], Lung, and [place], wholefoodsmarket.com  Limit added sugars When you follow a heart-healthy eating plan, you should limit the amount of calories you consume each day from added sugars. Because added sugars do not provide essential nutrients and are extra calories, limiting them can help you choose nutrient-rich foods and stay within your daily calorie limit. Some foods, such as fruit, contain natural sugars. Added sugars do not occur naturally in foods, but instead are used  to sweeten foods and drinks. Some examples of added sugars include brown sugar, corn syrup, dextrose, fructose, glucose, high-fructose corn syrup, raw sugar, and sucrose. In the [place], sweetened drinks, snacks, and sweets are the major sources of added sugars. Sweetened drinks account for about half of all added sugars consumed. The following are examples of foods and drinks with added sugars. Sweetened drinks include soft drinks or  sodas, fruit drinks, sweetened coffee and tea, energy drinks, alcoholic drinks, and favored waters.  Snacks and sweets include grain-based desserts such as cakes, pies, cookies, brownies, doughnuts; dairy desserts such as ice cream, frozen desserts, and pudding; candies; sugars; jams; syrups; and sweet toppings. To help you reduce the amount of added sugars in your diet: Choose unsweetened or whole fruits for snacks or dessert.  Choose drinks without added sugar such as water, low-fat or fat-free milk, or 100 percent fruit or vegetable juice.  Limit intake of sweetened drinks, snacks and desserts by eating them less often and in smaller amounts.  If you drink alcohol, you should limit your intake. Men should have no more than two alcoholic drinks per day. Women should have no more than one alcoholic drink per day. One drink is: 12 ounces of regular beer (5 percent alcohol)  5 ounces of wine (12 percent alcohol)  1 ounces of 80-proof liquor (40 percent alcohol)    DASH Eating Plan DASH stands for "Dietary Approaches to Stop Hypertension." The DASH eating plan is a healthy eating plan that has been shown to reduce high blood pressure (hypertension). It may also reduce your risk for type 2 diabetes, heart disease, and stroke. The DASH eating plan may also help with weight loss. What are tips for following this plan? General guidelines  Avoid eating more than 2,300 mg (milligrams) of salt (sodium) a day. If you have hypertension, you may need to reduce your sodium intake to 1,500 mg a day.  Limit alcohol intake to no more than 1 drink a day for nonpregnant women and 2 drinks a day for men. One drink equals 12 oz of beer, 5 oz of wine, or 1 oz of hard liquor.  Work with your health care provider to maintain a healthy body weight or to lose weight. Ask what an ideal weight is for you.  Get at least 30 minutes of exercise that causes your heart to beat faster (aerobic exercise) most days of the  week. Activities may include walking, swimming, or biking.  Work with your health care provider or diet and nutrition specialist (dietitian) to adjust your eating plan to your individual calorie needs. Reading food labels  Check food labels for the amount of sodium per serving. Choose foods with less than 5 percent of the Daily Value of sodium. Generally, foods with less than 300 mg of sodium per serving fit into this eating plan.  To find whole grains, look for the word "whole" as the first word in the ingredient list. Shopping  Buy products labeled as "low-sodium" or "no salt added."  Buy fresh foods. Avoid canned foods and premade or frozen meals. Cooking  Avoid adding salt when cooking. Use salt-free seasonings or herbs instead of table salt or sea salt. Check with your health care provider or pharmacist before using salt substitutes.  Do not fry foods. Cook foods using healthy methods such as baking, boiling, grilling, and broiling instead.  Cook with heart-healthy oils, such as olive, canola, soybean, or sunflower oil. Meal planning   Eat a balanced  diet that includes: ? 5 or more servings of fruits and vegetables each day. At each meal, try to fill half of your plate with fruits and vegetables. ? Up to 6-8 servings of whole grains each day. ? Less than 6 oz of lean meat, poultry, or fish each day. A 3-oz serving of meat is about the same size as a deck of cards. One egg equals 1 oz. ? 2 servings of low-fat dairy each day. ? A serving of nuts, seeds, or beans 5 times each week. ? Heart-healthy fats. Healthy fats called Omega-3 fatty acids are found in foods such as flaxseeds and coldwater fish, like sardines, salmon, and mackerel.  Limit how much you eat of the following: ? Canned or prepackaged foods. ? Food that is high in trans fat, such as fried foods. ? Food that is high in saturated fat, such as fatty meat. ? Sweets, desserts, sugary drinks, and other foods with added  sugar. ? Full-fat dairy products.  Do not salt foods before eating.  Try to eat at least 2 vegetarian meals each week.  Eat more home-cooked food and less restaurant, buffet, and fast food.  When eating at a restaurant, ask that your food be prepared with less salt or no salt, if possible. What foods are recommended? The items listed may not be a complete list. Talk with your dietitian about what dietary choices are best for you. Grains Whole-grain or whole-wheat bread. Whole-grain or whole-wheat pasta. Brown rice. [place]. Bulgur. Whole-grain and low-sodium cereals. Pita bread. Low-fat, low-sodium crackers. Whole-wheat flour tortillas. Vegetables Fresh or frozen vegetables (raw, steamed, roasted, or grilled). Low-sodium or reduced-sodium tomato and vegetable juice. Low-sodium or reduced-sodium tomato sauce and tomato paste. Low-sodium or reduced-sodium canned vegetables. Fruits All fresh, dried, or frozen fruit. Canned fruit in natural juice (without added sugar). Meat and other protein foods Skinless chicken or [place]. Ground chicken or [place]. Pork with fat trimmed off. Fish and seafood. Egg whites. Dried beans, peas, or lentils. Unsalted nuts, nut butters, and seeds. Unsalted canned beans. Lean cuts of beef with fat trimmed off. Low-sodium, lean deli meat. Dairy Low-fat (1%) or fat-free (skim) milk. Fat-free, low-fat, or reduced-fat cheeses. Nonfat, low-sodium ricotta or cottage cheese. Low-fat or nonfat yogurt. Low-fat, low-sodium cheese. Fats and oils Soft margarine without trans fats. Vegetable oil. Low-fat, reduced-fat, or light mayonnaise and salad dressings (reduced-sodium). Canola, safflower, olive, soybean, and sunflower oils. Avocado. Seasoning and other foods Herbs. Spices. Seasoning mixes without salt. Unsalted popcorn and pretzels. Fat-free sweets. What foods are not recommended? The items listed may not be a complete list. Talk with your dietitian about what  dietary choices are best for you. Grains Baked goods made with fat, such as croissants, muffins, or some breads. Dry pasta or rice meal packs. Vegetables Creamed or fried vegetables. Vegetables in a cheese sauce. Regular canned vegetables (not low-sodium or reduced-sodium). Regular canned tomato sauce and paste (not low-sodium or reduced-sodium). Regular tomato and vegetable juice (not low-sodium or reduced-sodium). Angie Fava. Olives. Fruits Canned fruit in a light or heavy syrup. Fried fruit. Fruit in cream or butter sauce. Meat and other protein foods Fatty cuts of meat. Ribs. Fried meat. Berniece Salines. Sausage. Bologna and other processed lunch meats. Salami. Fatback. Hotdogs. Bratwurst. Salted nuts and seeds. Canned beans with added salt. Canned or smoked fish. Whole eggs or egg yolks. Chicken or [place] with skin. Dairy Whole or 2% milk, cream, and half-and-half. Whole or full-fat cream cheese. Whole-fat or sweetened yogurt. Full-fat cheese. Nondairy  creamers. Whipped toppings. Processed cheese and cheese spreads. Fats and oils Butter. Stick margarine. Lard. Shortening. Ghee. Bacon fat. Tropical oils, such as coconut, palm kernel, or palm oil. Seasoning and other foods Salted popcorn and pretzels. Onion salt, garlic salt, seasoned salt, table salt, and sea salt. Worcestershire sauce. Tartar sauce. Barbecue sauce. Teriyaki sauce. Soy sauce, including reduced-sodium. Steak sauce. Canned and packaged gravies. Fish sauce. Oyster sauce. Cocktail sauce. Horseradish that you find on the shelf. Ketchup. Mustard. Meat flavorings and tenderizers. Bouillon cubes. Hot sauce and Tabasco sauce. Premade or packaged marinades. Premade or packaged taco seasonings. Relishes. Regular salad dressings. Where to find more information:  National Heart, Lung, and [place]: https://wilson-eaton.com/  American Heart Association: www.heart.org Summary  The DASH eating plan is a healthy eating plan that has been shown to reduce  high blood pressure (hypertension). It may also reduce your risk for type 2 diabetes, heart disease, and stroke.  With the DASH eating plan, you should limit salt (sodium) intake to 2,300 mg a day. If you have hypertension, you may need to reduce your sodium intake to 1,500 mg a day.  When on the DASH eating plan, aim to eat more fresh fruits and vegetables, whole grains, lean proteins, low-fat dairy, and heart-healthy fats.  Work with your health care provider or diet and nutrition specialist (dietitian) to adjust your eating plan to your individual calorie needs. This information is not intended to replace advice given to you by your health care provider. Make sure you discuss any questions you have with your health care provider. Document Released: 08/22/2011 Document Revised: 08/26/2016 Document Reviewed: 08/26/2016 Elsevier Interactive Patient Education  2017 [employer].   IF you received an x-ray today, you will receive an invoice from [facility]. Please contact [facility] at [phone] with questions or concerns regarding your invoice.   IF you received labwork today, you will receive an invoice from [facility]. Please contact [facility] at [phone] with questions or concerns regarding your invoice.   Our billing staff will not be able to assist you with questions regarding bills from these companies.  You will be contacted with the lab results as soon as they are available. The fastest way to get your results is to activate your My Chart account. Instructions are located on the last page of this paperwork. If you have not heard from [place] regarding the results in 2 weeks, please contact this office.

## 2017-08-09 LAB — LIPID PANEL
Chol/HDL Ratio: 4.4 ratio (ref 0.0–4.4)
Cholesterol, Total: 225 mg/dL — ABNORMAL HIGH (ref 100–199)
HDL: 51 mg/dL (ref >39)
LDL Calculated: 134 mg/dL — ABNORMAL HIGH (ref 0–99)
Triglycerides: 199 mg/dL — ABNORMAL HIGH (ref 0–149)
VLDL Cholesterol Cal: 40 mg/dL (ref 5–40)

## 2017-08-09 LAB — CMP14+EGFR
ALT: 23 IU/L (ref 0–32)
AST: 19 IU/L (ref 0–40)
Albumin/Globulin Ratio: 1.3 (ref 1.2–2.2)
Albumin: 4.4 g/dL (ref 3.5–5.5)
Alkaline Phosphatase: 79 IU/L (ref 39–117)
BUN/Creatinine Ratio: 20 (ref 9–23)
BUN: 14 mg/dL (ref 6–24)
Bilirubin Total: 0.3 mg/dL (ref 0.0–1.2)
CO2: 23 mmol/L (ref 20–29)
Calcium: 9.6 mg/dL (ref 8.7–10.2)
Chloride: 99 mmol/L (ref 96–106)
Creatinine, Ser: 0.69 mg/dL (ref 0.57–1.00)
GFR calc Af Amer: 117 mL/min/{1.73_m2} (ref >59)
GFR calc non Af Amer: 101 mL/min/{1.73_m2} (ref >59)
Globulin, Total: 3.3 g/dL (ref 1.5–4.5)
Glucose: 255 mg/dL — ABNORMAL HIGH (ref 65–99)
Potassium: 3.9 mmol/L (ref 3.5–5.2)
Sodium: 138 mmol/L (ref 134–144)
Total Protein: 7.7 g/dL (ref 6.0–8.5)

## 2017-08-09 LAB — HEMOGLOBIN A1C
Est. average glucose Bld gHb Est-mCnc: 278 mg/dL
Hgb A1c MFr Bld: 11.3 % — ABNORMAL HIGH (ref 4.8–5.6)

## 2017-08-09 LAB — MICROALBUMIN, URINE: Microalbumin, Urine: 14 ug/mL

## 2017-08-18 ENCOUNTER — Encounter: Payer: Self-pay | Admitting: Gastroenterology

## 2017-09-01 ENCOUNTER — Telehealth: Payer: Self-pay

## 2017-09-01 NOTE — Telephone Encounter (Signed)
No OV needed - must be rescheduled to one of my outpatient blocks at [facility]. I believe my next available is probably February.

## 2017-09-01 NOTE — Telephone Encounter (Signed)
Will route to [place] to schedule at [facility].

## 2017-09-01 NOTE — Telephone Encounter (Signed)
Dr. Loletha Carrow,  Virginia [name] is a direct screening colonoscopy that is scheduled for 10/09/17. In preparing her Pre-Visit chart I noticed that her BMI is 52.98. Her medical history is Diabetic, Hypertension and Hyperlipidemia. Would you like for her to be scheduled for an office visit prior to her procedure or would you like for her to be a direct hospital procedure? Thanks.  Riki Sheer, LPN ( [facility] )

## 2017-09-02 NOTE — Telephone Encounter (Signed)
Patient calling ready to get procedure rescheduled at [facility] in Feb. Pt notified that nurse will call her to sch.

## 2017-09-02 NOTE — Telephone Encounter (Signed)
Rescheduled patient to [facility] hospital for screening colonoscopy on 10/07/17 and pv on 09/23/17. Patient aware. PBDH#[id number].

## 2017-09-17 ENCOUNTER — Ambulatory Visit
Admission: RE | Admit: 2017-09-17 | Discharge: 2017-09-17 | Disposition: A | Discharge status: Home / Self Care | Payer: 59 | Source: Ambulatory Visit | Attending: Physician Assistant | Admitting: Physician Assistant

## 2017-09-17 ENCOUNTER — Ambulatory Visit: Payer: 59 | Admitting: Physician Assistant

## 2017-09-17 DIAGNOSIS — Z1239 Encounter for other screening for malignant neoplasm of breast: Secondary | ICD-10-CM

## 2017-09-17 DIAGNOSIS — Z1231 Encounter for screening mammogram for malignant neoplasm of breast: Secondary | ICD-10-CM | POA: Diagnosis not present

## 2017-09-18 ENCOUNTER — Encounter: Payer: Self-pay | Admitting: Physician Assistant

## 2017-09-23 ENCOUNTER — Encounter: Payer: Self-pay | Admitting: Physician Assistant

## 2017-09-23 ENCOUNTER — Other Ambulatory Visit: Payer: Self-pay

## 2017-09-23 ENCOUNTER — Ambulatory Visit: Payer: 59 | Admitting: Physician Assistant

## 2017-09-23 ENCOUNTER — Ambulatory Visit (AMBULATORY_SURGERY_CENTER): Payer: Self-pay

## 2017-09-23 VITALS — BP 126/82 | HR 82 | Resp 16 | Ht 63.0 in | Wt 298.2 lb

## 2017-09-23 VITALS — Ht 63.0 in | Wt 298.8 lb

## 2017-09-23 DIAGNOSIS — E119 Type 2 diabetes mellitus without complications: Secondary | ICD-10-CM | POA: Diagnosis not present

## 2017-09-23 DIAGNOSIS — Z1211 Encounter for screening for malignant neoplasm of colon: Secondary | ICD-10-CM

## 2017-09-23 DIAGNOSIS — Z23 Encounter for immunization: Secondary | ICD-10-CM

## 2017-09-23 DIAGNOSIS — R6889 Other general symptoms and signs: Secondary | ICD-10-CM

## 2017-09-23 DIAGNOSIS — Z794 Long term (current) use of insulin: Secondary | ICD-10-CM | POA: Diagnosis not present

## 2017-09-23 DIAGNOSIS — I10 Essential (primary) hypertension: Secondary | ICD-10-CM | POA: Diagnosis not present

## 2017-09-23 LAB — POCT GLYCOSYLATED HEMOGLOBIN (HGB A1C): Hemoglobin A1C: 10.3

## 2017-09-23 MED ORDER — PLENVU 140 G PO SOLR: 1.0000 | Freq: Once | ORAL | 0 refills | Status: AC

## 2017-09-23 NOTE — Progress Notes (Signed)
Denies allergies to eggs or soy products. Denies complication of anesthesia or sedation. Denies use of weight loss medication. Denies use of O2.   Emmi instructions declined.  

## 2017-09-23 NOTE — Patient Instructions (Addendum)
Keep up the good work!! Come back and see me in 3 months for your annual physical.   What are the benefits of exercise?-Exercise has many benefits. It can: ?Burn calories, which helps people control their weight ?Help control blood sugar levels in people with diabetes ?Lower blood pressure, especially in people with high blood pressure ?Lower stress and help with depression ?Keep bones strong, so they don't get thin and break easily ?Lower the chance of dying from heart disease   What are the main types of exercise?-There are 3 main types of exercise. They are: ?Aerobic exercise - Aerobic exercise raises a person's heart rate. Examples of aerobic exercise are walking, running, or swimming. ?Resistance training - Resistance training helps make your muscles stronger. People can do this type of exercise using weights, exercise bands, or weight machines. ?Stretching - Stretching exercises help your muscles and joints move more easily. It's important to have all 3 types of exercise in your exercise program. That way, your body, muscles, and joints can be as healthy as possible.  For substantial health benefits, adults are recommended to perform moderate-intensity aerobic exercise or vigorous aerobic exercise as follows: ?Moderate-intensity aerobic exercise for 150 minutes every week AND muscle-strengthening activities involving all major muscle groups at least two days per week, OR ?Vigorous-intensity aerobic exercise for 75 minutes every week AND muscle-strengthening activities involving all major muscle groups at least two days per week, OR ?An equivalent mix of moderate- and vigorous-intensity aerobic exercise AND muscle-strengthening activities involving all major muscle groups at least two days per week  What should I do when I exercise?-Each time you exercise, you should: ?Warm up - Warming up can help keep you from hurting your muscles when you exercise. To warm up, do a light aerobic  exercise (such as walking slowly) or stretch for 5 to 10 minutes. ?Work out - During a workout, you can walk fast, swim, run, or use an exercise machine, for example. You should also stretch all of your joints, including your neck, shoulders, back, hips, and knees. At least 2 times a week, you can add resistance training exercises to your workout. ?Cool down - Cooling down helps keep you from feeling dizzy after you exercise and helps prevent muscle cramps. To cool down, you can stretch or do a light aerobic exercise for 5 minutes.  How often should I exercise?-Doctors recommend that people exercise at least 30 minutes a day, on 5 or more days of the week. If you can't exercise for 30 minutes straight, try to exercise for 10 minutes at a time, 3 or 4 times a day.   Diabetes Mellitus and Nutrition When you have diabetes (diabetes mellitus), it is very important to have healthy eating habits because your blood sugar (glucose) levels are greatly affected by what you eat and drink. Eating healthy foods in the appropriate amounts, at about the same times every day, can help you:  Control your blood glucose.  Lower your risk of heart disease.  Improve your blood pressure.  Reach or maintain a healthy weight.  Every person with diabetes is different, and each person has different needs for a meal plan. Your health care provider may recommend that you work with a diet and nutrition specialist (dietitian) to make a meal plan that is best for you. Your meal plan may vary depending on factors such as:  The calories you need.  The medicines you take.  Your weight.  Your blood glucose, blood pressure, and cholesterol levels.  Your activity level.  Other health conditions you have, such as heart or kidney disease.  How do carbohydrates affect me? Carbohydrates affect your blood glucose level more than any other type of food. Eating carbohydrates naturally increases the amount of glucose in your  blood. Carbohydrate counting is a method for keeping track of how many carbohydrates you eat. Counting carbohydrates is important to keep your blood glucose at a healthy level, especially if you use insulin or take certain oral diabetes medicines. It is important to know how many carbohydrates you can safely have in each meal. This is different for every person. Your dietitian can help you calculate how many carbohydrates you should have at each meal and for snack. Foods that contain carbohydrates include:  Bread, cereal, rice, pasta, and crackers.  Potatoes and corn.  Peas, beans, and lentils.  Milk and yogurt.  Fruit and juice.  Desserts, such as cakes, cookies, ice cream, and candy.  How does alcohol affect me? Alcohol can cause a sudden decrease in blood glucose (hypoglycemia), especially if you use insulin or take certain oral diabetes medicines. Hypoglycemia can be a life-threatening condition. Symptoms of hypoglycemia (sleepiness, dizziness, and confusion) are similar to symptoms of having too much alcohol. If your health care provider says that alcohol is safe for you, follow these guidelines:  Limit alcohol intake to no more than 1 drink per day for nonpregnant women and 2 drinks per day for men. One drink equals 12 oz of beer, 5 oz of wine, or 1 oz of hard liquor.  Do not drink on an empty stomach.  Keep yourself hydrated with water, diet soda, or unsweetened iced tea.  Keep in mind that regular soda, juice, and other mixers may contain a lot of sugar and must be counted as carbohydrates.  What are tips for following this plan? Reading food labels  Start by checking the serving size on the label. The amount of calories, carbohydrates, fats, and other nutrients listed on the label are based on one serving of the food. Many foods contain more than one serving per package.  Check the total grams (g) of carbohydrates in one serving. You can calculate the number of servings of  carbohydrates in one serving by dividing the total carbohydrates by 15. For example, if a food has 30 g of total carbohydrates, it would be equal to 2 servings of carbohydrates.  Check the number of grams (g) of saturated and trans fats in one serving. Choose foods that have low or no amount of these fats.  Check the number of milligrams (mg) of sodium in one serving. Most people should limit total sodium intake to less than 2,300 mg per day.  Always check the nutrition information of foods labeled as "low-fat" or "nonfat". These foods may be higher in added sugar or refined carbohydrates and should be avoided.  Talk to your dietitian to identify your daily goals for nutrients listed on the label. Shopping  Avoid buying canned, premade, or processed foods. These foods tend to be high in fat, sodium, and added sugar.  Shop around the outside edge of the grocery store. This includes fresh fruits and vegetables, bulk grains, fresh meats, and fresh dairy. Cooking  Use low-heat cooking methods, such as baking, instead of high-heat cooking methods like deep frying.  Cook using healthy oils, such as olive, canola, or sunflower oil.  Avoid cooking with butter, cream, or high-fat meats. Meal planning  Eat meals and snacks regularly, preferably at the same  times every day. Avoid going long periods of time without eating.  Eat foods high in fiber, such as fresh fruits, vegetables, beans, and whole grains. Talk to your dietitian about how many servings of carbohydrates you can eat at each meal.  Eat 4-6 ounces of lean protein each day, such as lean meat, chicken, fish, eggs, or tofu. 1 ounce is equal to 1 ounce of meat, chicken, or fish, 1 egg, or 1/4 cup of tofu.  Eat some foods each day that contain healthy fats, such as avocado, nuts, seeds, and fish. Lifestyle   Check your blood glucose regularly.  Exercise at least 30 minutes 5 or more days each week, or as told by your health care  provider.  Take medicines as told by your health care provider.  Do not use any products that contain nicotine or tobacco, such as cigarettes and e-cigarettes. If you need help quitting, ask your health care provider.  Work with a Social worker or diabetes educator to identify strategies to manage stress and any emotional and social challenges. What are some questions to ask my health care provider?  Do I need to meet with a diabetes educator?  Do I need to meet with a dietitian?  What number can I call if I have questions?  When are the best times to check my blood glucose? Where to find more information:  American Diabetes Association: diabetes.org/food-and-fitness/food  Academy of Nutrition and Dietetics: PokerClues.dk  [employer] of Diabetes and Digestive and Kidney Diseases (NIH): ContactWire.be Summary  A healthy meal plan will help you control your blood glucose and maintain a healthy lifestyle.  Working with a diet and nutrition specialist (dietitian) can help you make a meal plan that is best for you.  Keep in mind that carbohydrates and alcohol have immediate effects on your blood glucose levels. It is important to count carbohydrates and to use alcohol carefully. This information is not intended to replace advice given to you by your health care provider. Make sure you discuss any questions you have with your health care provider. Document Released: 05/30/2005 Document Revised: 10/07/2016 Document Reviewed: 10/07/2016 Elsevier Interactive Patient Education  2018 [employer].   IF you received an x-ray today, you will receive an invoice from [facility] Radiology. Please contact [facility] at [phone] with questions or concerns regarding your invoice.   IF you received labwork today, you will receive an invoice from [facility].  Please contact [facility] at [phone] with questions or concerns regarding your invoice.   Our billing staff will not be able to assist you with questions regarding bills from these companies.  You will be contacted with the lab results as soon as they are available. The fastest way to get your results is to activate your My Chart account. Instructions are located on the last page of this paperwork. If you have not heard from [place] regarding the results in 2 weeks, please contact this office.

## 2017-09-23 NOTE — Progress Notes (Signed)
Virginia [name]  MRN: [id number] DOB: 1966-01-01  PCP: Patient, No Pcp Per  Subjective:  Pt is a pleasant 52 year old female PMH HTN and DM who presents to clinic for blood pressure and DM check. Last OV 08/08/2017 - she had been out of all medications >1 year.   HTN - Today's blood pressure is 126/82. Last OV started back on Norvasc 68m, HCTZ 235m Since starting medication she feels cold. Blood pressure at that OV was 174/130.   DM - refilled Metformin and Lantus last OV. She is taking this as directed on a daily basis. She checks home blood sugars. 150-270.  Denies HA, lightheadedness, dizziness, vision changes, sock and glove paresthesia, increased urinary frequency.    Pt is working on making lifestyle changes. She is walking a lot more. Feeling good. She no longer has to sit down to rest when she gets home. Walks up her stairs without feeling winded. Making changes to her diet. Her daughter started juicing - she is drinking lemon and cyanne juice which has helped her LES swelling.  Potato chips are her downfall. She is interested in gastric bypass surgery in the future.   Last PAP 2009.  She has colonoscopy next month.  She would like tdap today.   Review of Systems  Constitutional: Negative for diaphoresis.  Respiratory: Negative for shortness of breath.   Cardiovascular: Negative for chest pain, palpitations and leg swelling.  Gastrointestinal: Negative for abdominal pain, diarrhea, nausea and vomiting.  Endocrine: Negative for polydipsia, polyphagia and polyuria.  Neurological: Negative for dizziness, light-headedness and headaches.    Patient Active Problem List   Diagnosis Date Noted  . [place]25[place]2016  . Essential hypertension 02/08/2015    Current Outpatient Medications on File Prior to Visit  Medication Sig Dispense Refill  . amLODipine (NORVASC) 10 MG tablet Take 1 tablet (10 mg total) by mouth daily. 30 tablet 2  . atorvastatin (LIPITOR) 10 MG  tablet Take 1 tablet (10 mg total) by mouth daily. 30 tablet 2  . Blood Glucose Monitoring Suppl (TRUE METRIX METER) W/DEVICE KIT Use as directed 1 kit 11  . glucose blood (TRUE METRIX BLOOD GLUCOSE TEST) test strip Use as instructed 100 each 12  . hydrochlorothiazide (HYDRODIURIL) 25 MG tablet Take 1 tablet (25 mg total) by mouth daily. 30 tablet 2  . Insulin Glargine (LANTUS SOLOSTAR) 100 UNIT/ML Solostar Pen Start with 10 units and increase by 2 units a day every 2 days if BS over 150. 5 pen PRN  . Insulin Pen Needle 32G X 6 MM MISC Inject 10 units daily at bed time. Advance 2 units every 2 days until a fasting blood sugar of 130. 100 each 11  . metFORMIN (GLUCOPHAGE) 500 MG tablet Take 1 tablet (500 mg total) by mouth 2 (two) times daily with a meal. 180 tablet 0  . PLENVU 140 g SOLR Take 1 kit by mouth once for 1 dose. 1 each 0  . TRUEPLUS LANCETS 26G MISC Use as directed! 100 each 11   No current facility-administered medications on file prior to visit.     No Known Allergies   Objective:  BP 126/82 (BP Location: Left Arm, Patient Position: Sitting, Cuff Size: Large)   Pulse 82   Resp 16   Ht '5\' 3"'  (1.6 m)   Wt 298 lb 3.2 oz (135.3 kg)   SpO2 100%   BMI 52.82 kg/m   Physical Exam  Constitutional: She is oriented to person, place,  and time and well-developed, well-nourished, and in no distress. No distress.  obese  Cardiovascular: Normal rate, regular rhythm and normal heart sounds.  Musculoskeletal:       Right lower leg: She exhibits edema (1+).       Left lower leg: She exhibits edema (1+).  Neurological: She is alert and oriented to person, place, and time. GCS score is 15.  Skin: Skin is warm and dry.  Psychiatric: Mood, memory, affect and judgment normal.  Vitals reviewed.  Results for orders placed or performed in visit on 09/23/17  POCT glycosylated hemoglobin (Hb A1C)  Result Value Ref Range   Hemoglobin A1C 10.3     Assessment and Plan :  1. Sensation of  feeling cold 2. Essential hypertension - CBC with Differential/Platelet - Iron, TIBC and Ferritin Panel 3. Type 2 diabetes mellitus without complication, with long-term current use of insulin (HCC) - POCT glycosylated hemoglobin (Hb A1C) - CMP14+EGFR - Blood pressure is controlled. Today's pressure is 126/82/ Pt is working hard on lifestyle changes. C/o feeling cold, will check CBC and iron. Recheck DM in 3 months, consider increasing Metformin dose. Labs are pending.  4. Need for diphtheria-tetanus-pertussis (Tdap) vaccine - Tdap vaccine greater than or equal to 7yo IM   Mercer Pod, PA-C  [facility] [place] 09/23/2017 10:01 AM

## 2017-09-24 LAB — CBC WITH DIFFERENTIAL/PLATELET
Basophils Absolute: 0.1 10*3/uL (ref 0.0–0.2)
Basos: 1 %
EOS (ABSOLUTE): 0.2 10*3/uL (ref 0.0–0.4)
Eos: 3 %
Hematocrit: 40.9 % (ref 34.0–46.6)
Hemoglobin: 13.1 g/dL (ref 11.1–15.9)
Immature Grans (Abs): 0 10*3/uL (ref 0.0–0.1)
Immature Granulocytes: 0 %
Lymphocytes Absolute: 3.1 10*3/uL (ref 0.7–3.1)
Lymphs: 39 %
MCH: 30.1 pg (ref 26.6–33.0)
MCHC: 32 g/dL (ref 31.5–35.7)
MCV: 94 fL (ref 79–97)
Monocytes Absolute: 0.4 10*3/uL (ref 0.1–0.9)
Monocytes: 5 %
Neutrophils Absolute: 4.1 10*3/uL (ref 1.4–7.0)
Neutrophils: 52 %
Platelets: 426 10*3/uL — ABNORMAL HIGH (ref 150–379)
RBC: 4.35 x10E6/uL (ref 3.77–5.28)
RDW: 13 % (ref 12.3–15.4)
WBC: 7.9 10*3/uL (ref 3.4–10.8)

## 2017-09-24 LAB — CMP14+EGFR
ALT: 17 IU/L (ref 0–32)
AST: 17 IU/L (ref 0–40)
Albumin/Globulin Ratio: 1.2 (ref 1.2–2.2)
Albumin: 4.3 g/dL (ref 3.5–5.5)
Alkaline Phosphatase: 79 IU/L (ref 39–117)
BUN/Creatinine Ratio: 23 (ref 9–23)
BUN: 20 mg/dL (ref 6–24)
Bilirubin Total: 0.6 mg/dL (ref 0.0–1.2)
CO2: 25 mmol/L (ref 20–29)
Calcium: 9.7 mg/dL (ref 8.7–10.2)
Chloride: 99 mmol/L (ref 96–106)
Creatinine, Ser: 0.88 mg/dL (ref 0.57–1.00)
GFR calc Af Amer: 88 mL/min/{1.73_m2} (ref >59)
GFR calc non Af Amer: 76 mL/min/{1.73_m2} (ref >59)
Globulin, Total: 3.6 g/dL (ref 1.5–4.5)
Glucose: 239 mg/dL — ABNORMAL HIGH (ref 65–99)
Potassium: 4.3 mmol/L (ref 3.5–5.2)
Sodium: 139 mmol/L (ref 134–144)
Total Protein: 7.9 g/dL (ref 6.0–8.5)

## 2017-09-24 LAB — IRON,TIBC AND FERRITIN PANEL
Ferritin: 203 ng/mL — ABNORMAL HIGH (ref 15–150)
Iron Saturation: 40 % (ref 15–55)
Iron: 133 ug/dL (ref 27–159)
Total Iron Binding Capacity: 334 ug/dL (ref 250–450)
UIBC: 201 ug/dL (ref 131–425)

## 2017-09-25 ENCOUNTER — Telehealth: Payer: Self-pay | Admitting: Gastroenterology

## 2017-09-25 ENCOUNTER — Other Ambulatory Visit: Payer: Self-pay

## 2017-09-25 ENCOUNTER — Encounter (HOSPITAL_COMMUNITY): Payer: Self-pay | Admitting: Emergency Medicine

## 2017-09-25 MED ORDER — PEG-KCL-NACL-NASULF-NA ASC-C 140 G PO SOLR: 1.0000 | ORAL | 0 refills | Status: DC

## 2017-09-25 NOTE — Telephone Encounter (Signed)
Resent script to pharmacy- called and notified pt- she states she did the online plenvu coupon- informed her to take to pharmacy   Lelan Pons pV

## 2017-09-29 ENCOUNTER — Encounter: Payer: Self-pay | Admitting: Physician Assistant

## 2017-09-30 ENCOUNTER — Telehealth: Payer: Self-pay | Admitting: Gastroenterology

## 2017-10-01 NOTE — Telephone Encounter (Signed)
Will call pt when we have more prep samples of Plenvu. If we don't get any we can change the prep.

## 2017-10-01 NOTE — Telephone Encounter (Signed)
Pt aware to pick up the prep at the front desk.

## 2017-10-07 ENCOUNTER — Ambulatory Visit (HOSPITAL_COMMUNITY)
Admission: RE | Admit: 2017-10-07 | Discharge: 2017-10-07 | Disposition: A | Discharge status: Home / Self Care | Payer: 59 | Source: Ambulatory Visit | Attending: Gastroenterology | Admitting: Gastroenterology

## 2017-10-07 ENCOUNTER — Encounter (HOSPITAL_COMMUNITY)
Admission: RE | Disposition: A | Discharge status: Home / Self Care | Payer: Self-pay | Source: Ambulatory Visit | Attending: Gastroenterology

## 2017-10-07 ENCOUNTER — Encounter (HOSPITAL_COMMUNITY): Payer: Self-pay

## 2017-10-07 ENCOUNTER — Ambulatory Visit (HOSPITAL_COMMUNITY): Payer: 59 | Admitting: Certified Registered Nurse Anesthetist

## 2017-10-07 ENCOUNTER — Other Ambulatory Visit: Payer: Self-pay

## 2017-10-07 DIAGNOSIS — E079 Disorder of thyroid, unspecified: Secondary | ICD-10-CM | POA: Insufficient documentation

## 2017-10-07 DIAGNOSIS — E119 Type 2 diabetes mellitus without complications: Secondary | ICD-10-CM | POA: Diagnosis not present

## 2017-10-07 DIAGNOSIS — Z1211 Encounter for screening for malignant neoplasm of colon: Secondary | ICD-10-CM | POA: Insufficient documentation

## 2017-10-07 DIAGNOSIS — K5730 Diverticulosis of large intestine without perforation or abscess without bleeding: Secondary | ICD-10-CM | POA: Insufficient documentation

## 2017-10-07 DIAGNOSIS — D649 Anemia, unspecified: Secondary | ICD-10-CM | POA: Diagnosis not present

## 2017-10-07 DIAGNOSIS — E785 Hyperlipidemia, unspecified: Secondary | ICD-10-CM | POA: Insufficient documentation

## 2017-10-07 DIAGNOSIS — Z794 Long term (current) use of insulin: Secondary | ICD-10-CM | POA: Diagnosis not present

## 2017-10-07 DIAGNOSIS — I10 Essential (primary) hypertension: Secondary | ICD-10-CM | POA: Diagnosis not present

## 2017-10-07 HISTORY — PX: COLONOSCOPY WITH PROPOFOL: SHX5780

## 2017-10-07 LAB — GLUCOSE, CAPILLARY
GLUCOSE-CAPILLARY: 303 mg/dL — AB (ref 65–99)
Glucose-Capillary: 273 mg/dL — ABNORMAL HIGH (ref 65–99)
Glucose-Capillary: 306 mg/dL — ABNORMAL HIGH (ref 65–99)
Glucose-Capillary: 186 mg/dL — ABNORMAL HIGH (ref 65–99)

## 2017-10-07 SURGERY — COLONOSCOPY WITH PROPOFOL
Anesthesia: Monitor Anesthesia Care

## 2017-10-07 MED ORDER — LACTATED RINGERS IV SOLN
INTRAVENOUS | Status: DC
  Administered 2017-10-07: 10:00:00 via INTRAVENOUS

## 2017-10-07 MED ORDER — PROPOFOL 10 MG/ML IV BOLUS
INTRAVENOUS | Status: DC | PRN
  Administered 2017-10-07 (×2): 20 mg via INTRAVENOUS

## 2017-10-07 MED ORDER — INSULIN ASPART 100 UNIT/ML ~~LOC~~ SOLN
7.0000 [IU] | Freq: Once | SUBCUTANEOUS | Status: AC
  Administered 2017-10-07: 7 [IU] via SUBCUTANEOUS
  Filled 2017-10-07 (×3): qty 0.07

## 2017-10-07 MED ORDER — LIDOCAINE 2% (20 MG/ML) 5 ML SYRINGE
INTRAMUSCULAR | Status: DC | PRN
  Administered 2017-10-07: 100 mg via INTRAVENOUS

## 2017-10-07 MED ORDER — SODIUM CHLORIDE 0.9 % IV SOLN: INTRAVENOUS | Status: DC

## 2017-10-07 MED ORDER — PROPOFOL 10 MG/ML IV BOLUS
INTRAVENOUS | Status: AC
  Filled 2017-10-07: qty 40

## 2017-10-07 MED ORDER — PROPOFOL 500 MG/50ML IV EMUL
INTRAVENOUS | Status: DC | PRN
  Administered 2017-10-07: 150 ug/kg/min via INTRAVENOUS

## 2017-10-07 MED ORDER — INSULIN ASPART 100 UNIT/ML ~~LOC~~ SOLN
7.0000 [IU] | Freq: Once | SUBCUTANEOUS | Status: DC
  Filled 2017-10-07: qty 0.07

## 2017-10-07 SURGICAL SUPPLY — 22 items

## 2017-10-07 NOTE — Anesthesia Preprocedure Evaluation (Addendum)
Anesthesia Evaluation  Patient identified by MRN, date of birth, ID band Patient awake    Reviewed: Allergy & Precautions, NPO status , Patient's Chart, lab work & pertinent test results  Airway Mallampati: II  TM Distance: >3 FB Neck ROM: Full    Dental  (+) Dental Advisory Given   Pulmonary neg pulmonary ROS,    Pulmonary exam normal breath sounds clear to auscultation       Cardiovascular hypertension, Pt. on medications Normal cardiovascular exam Rhythm:Regular Rate:Normal     Neuro/Psych negative neurological ROS  negative psych ROS   GI/Hepatic negative GI ROS, Neg liver ROS,   Endo/Other  diabetes, Type 2, Insulin DependentMorbid obesity  Renal/GU negative Renal ROS  negative genitourinary   Musculoskeletal negative musculoskeletal ROS (+)   Abdominal (+) + obese,   Peds  Hematology negative hematology ROS (+)   Anesthesia Other Findings   Reproductive/Obstetrics                            Anesthesia Physical Anesthesia Plan  ASA: III  Anesthesia Plan: MAC   Post-op Pain Management:    Induction: Intravenous  PONV Risk Score and Plan: Propofol infusion and Treatment may vary due to age or medical condition  Airway Management Planned: Nasal Cannula  Additional Equipment: None  Intra-op Plan:   Post-operative Plan:   Informed Consent: I have reviewed the patients History and Physical, chart, labs and discussed the procedure including the risks, benefits and alternatives for the proposed anesthesia with the patient or authorized representative who has indicated his/her understanding and acceptance.   Dental advisory given  Plan Discussed with: CRNA  Anesthesia Plan Comments:         Anesthesia Quick Evaluation

## 2017-10-07 NOTE — Anesthesia Postprocedure Evaluation (Signed)
Anesthesia Post Note  Patient: Virginia [name]  Procedure(s) Performed: COLONOSCOPY WITH PROPOFOL (N/A )     Patient location during evaluation: PACU Anesthesia Type: MAC Level of consciousness: awake and alert Pain management: pain level controlled Vital Signs Assessment: post-procedure vital signs reviewed and stable Respiratory status: spontaneous breathing, nonlabored ventilation and respiratory function stable Cardiovascular status: stable and blood pressure returned to baseline Anesthetic complications: no    Last Vitals:  Vitals:   10/07/17 1300 10/07/17 1310  BP: (!) 149/96 (!) 152/95  Pulse: 82 77  Resp: 14 15  Temp:    SpO2: 100% 100%    Last Pain:  Vitals:   10/07/17 1247  TempSrc: Oral                 Audry Pili

## 2017-10-07 NOTE — Discharge Instructions (Signed)
YOU HAD AN ENDOSCOPIC PROCEDURE TODAY: Refer to the procedure report and other information in the discharge instructions given to you for any specific questions about what was found during the examination. If this information does not answer your questions, please call [place] office at [phone] to clarify.  ° °YOU SHOULD EXPECT: Some feelings of bloating in the abdomen. Passage of more gas than usual. Walking can help get rid of the air that was put into your GI tract during the procedure and reduce the bloating. If you had a lower endoscopy (such as a colonoscopy or flexible sigmoidoscopy) you may notice spotting of blood in your stool or on the toilet paper. Some abdominal soreness may be present for a day or two, also. ° °DIET: Your first meal following the procedure should be a light meal and then it is ok to progress to your normal diet. A half-sandwich or bowl of soup is an example of a good first meal. Heavy or fried foods are harder to digest and may make you feel nauseous or bloated. Drink plenty of fluids but you should avoid alcoholic beverages for 24 hours. If you had a esophageal dilation, please see attached instructions for diet.   ° °ACTIVITY: Your care partner should take you home directly after the procedure. You should plan to take it easy, moving slowly for the rest of the day. You can resume normal activity the day after the procedure however YOU SHOULD NOT DRIVE, use power tools, machinery or perform tasks that involve climbing or major physical exertion for 24 hours (because of the sedation medicines used during the test).  ° °SYMPTOMS TO REPORT IMMEDIATELY: °A gastroenterologist can be reached at any hour. Please call [phone]  for any of the following symptoms:  °Following lower endoscopy (colonoscopy, flexible sigmoidoscopy) °Excessive amounts of blood in the stool  °Significant tenderness, worsening of abdominal pains  °Swelling of the abdomen that is new, acute  °Fever of 100° or  higher  °Following upper endoscopy (EGD, EUS, ERCP, esophageal dilation) °Vomiting of blood or coffee ground material  °New, significant abdominal pain  °New, significant chest pain or pain under the shoulder blades  °Painful or persistently difficult swallowing  °New shortness of breath  °Black, tarry-looking or red, bloody stools ° °FOLLOW UP:  °If any biopsies were taken you will be contacted by phone or by letter within the next 1-3 weeks. Call [phone]  if you have not heard about the biopsies in 3 weeks.  °Please also call with any specific questions about appointments or follow up tests. ° °

## 2017-10-07 NOTE — Op Note (Signed)
[facility] Patient Name: Virginia [name] Procedure Date: 10/07/2017 MRN: [id number] Attending MD: Estill Cotta. Loletha Carrow , MD Date of Birth: 1966/06/19 CSN: [id number] Age: 52 Admit Type: Outpatient Procedure:                Colonoscopy Indications:              Screening for colorectal malignant neoplasm, This                            is the patient's first colonoscopy Providers:                Mallie Mussel L. Loletha Carrow, MD, Cleda Daub, RN, Nevin Bloodgood, Technician, Christell Faith, CRNA Referring MD:             Juanda Crumble, NP Medicines:                Monitored Anesthesia Care Complications:            No immediate complications. Estimated Blood Loss:     Estimated blood loss: none. Procedure:                Pre-Anesthesia Assessment:                           - Prior to the procedure, a History and Physical                            was performed, and patient medications and                            allergies were reviewed. The patient's tolerance of                            previous anesthesia was also reviewed. The risks                            and benefits of the procedure and the sedation                            options and risks were discussed with the patient.                            All questions were answered, and informed consent                            was obtained. Prior Anticoagulants: The patient has                            taken no previous anticoagulant or antiplatelet                            agents. ASA Grade Assessment: III - A patient with  severe systemic disease. After reviewing the risks                            and benefits, the patient was deemed in                            satisfactory condition to undergo the procedure.                           After obtaining informed consent, the colonoscope                            was passed under direct vision. Throughout the                           procedure, the patient's blood pressure, pulse, and                            oxygen saturations were monitored continuously. The                            Colonoscope was introduced through the anus and                            advanced to the the cecum, identified by                            appendiceal orifice and ileocecal valve. The                            colonoscopy was performed with difficulty due to                            significant looping and the patient's body habitus.                            Successful completion of the procedure was aided by                            using manual pressure. The patient tolerated the                            procedure well. The quality of the bowel                            preparation was good. The ileocecal valve,                            appendiceal orifice, and rectum were photographed.                            The quality of the bowel preparation was evaluated  using the BBPS [facility] Bowel Preparation Scale)                            with scores of: Right Colon = 2, Transverse Colon =                            2 and Left Colon = 2. The total BBPS score equals 6                            (lavage performed). Scope In: 12:18:59 PM Scope Out: 12:35:23 PM Scope Withdrawal Time: 0 hours 7 minutes 28 seconds  Total Procedure Duration: 0 hours 16 minutes 24 seconds  Findings:      The perianal and digital rectal examinations were normal.      Multiple small-mouthed diverticula were found in the right colon. No       polyps were seen.      The exam was otherwise without abnormality on direct and retroflexion       views. Impression:               - Diverticulosis in the right colon.                           - The examination was otherwise normal on direct                            and retroflexion views.                           - No specimens collected. Moderate  Sedation:      MAC sedation used Recommendation:           - Patient has a contact number available for                            emergencies. The signs and symptoms of potential                            delayed complications were discussed with the                            patient. Return to normal activities tomorrow.                            Written discharge instructions were provided to the                            patient.                           - Resume previous diet.                           - Continue present medications.                           - Repeat colonoscopy in 10 years for  screening                            purposes. Procedure Code(s):        --- Professional ---                           [id number], Colonoscopy, flexible; diagnostic, including                            collection of specimen(s) by brushing or washing,                            when performed (separate procedure) Diagnosis Code(s):        --- Professional ---                           Z12.11, Encounter for screening for malignant                            neoplasm of colon                           K57.30, Diverticulosis of large intestine without                            perforation or abscess without bleeding CPT copyright 2016 American Medical Association. All rights reserved. The codes documented in this report are preliminary and upon coder review may  be revised to meet current compliance requirements. Khai Arrona L. Loletha Carrow, MD 10/07/2017 12:40:33 PM This report has been signed electronically. Number of Addenda: 0

## 2017-10-07 NOTE — Transfer of Care (Signed)
Immediate Anesthesia Transfer of Care Note  Patient: Virginia [name]  Procedure(s) Performed: COLONOSCOPY WITH PROPOFOL (N/A )  Patient Location: PACU  Anesthesia Type:MAC  Level of Consciousness: awake, alert  and patient cooperative  Airway & Oxygen Therapy: patient spontaneously breathing on nasal cannula  Post-op Assessment: Report given to RN and Post -op Vital signs reviewed and stable  Post vital signs: Reviewed and stable  Last Vitals:  Vitals:   10/07/17 0920  BP: (!) 156/96  Pulse: 88  Resp: 12  Temp: 36.7 C  SpO2: 99%    Last Pain:  Vitals:   10/07/17 0920  TempSrc: Oral         Complications: No apparent anesthesia complications

## 2017-10-07 NOTE — Anesthesia Procedure Notes (Signed)
Procedure Name: MAC Date/Time: 10/07/2017 12:12 PM Performed by: West Pugh, CRNA Pre-anesthesia Checklist: Patient identified, Emergency Drugs available, Suction available, Patient being monitored and Timeout performed Patient Re-evaluated:Patient Re-evaluated prior to induction Oxygen Delivery Method: Nasal cannula Placement Confirmation: positive ETCO2 and CO2 detector Dental Injury: Teeth and Oropharynx as per pre-operative assessment

## 2017-10-07 NOTE — H&P (Signed)
History:  This patient presents for endoscopic testing for colon cancer screening.  [place] [facility] Referring physician: Patient, No Pcp Per  Past Medical History: Past Medical History:  Diagnosis Date  . Anemia   . Diabetes mellitus without complication ([place])   . Hyperlipemia   . Hypertension   . Thyroid disease      Past Surgical History: Past Surgical History:  Procedure Laterality Date  . ABDOMINAL HYSTERECTOMY    . APPENDECTOMY      Allergies: No Known Allergies  Outpatient Meds: No current facility-administered medications for this encounter.       ___________________________________________________________________ Objective   Exam:  There were no vitals taken for this visit. Morbidly obese  CV: RRR without murmur, S1/S2, no JVD, no peripheral edema  Resp: clear to auscultation bilaterally, normal RR and effort noted  GI: soft, no tenderness, with active bowel sounds. No guarding or palpable organomegaly noted.  Neuro: awake, alert and oriented x 3. Normal gross motor function and fluent speech   Assessment:  Average risk for colo-rectal cancer  Plan:  colonoscopy   Nelida Meuse III

## 2017-10-07 NOTE — Interval H&P Note (Signed)
History and Physical Interval Note:  10/07/2017 9:20 AM  Virginia [name]  has presented today for surgery, with the diagnosis of colon cancer screening  The various methods of treatment have been discussed with the patient and family. After consideration of risks, benefits and other options for treatment, the patient has consented to  Procedure(s): COLONOSCOPY WITH PROPOFOL (N/A) as a surgical intervention .  The patient's history has been reviewed, patient examined, no change in status, stable for surgery.  I have reviewed the patient's chart and labs.  Questions were answered to the patient's satisfaction.     Nelida Meuse III

## 2017-10-09 ENCOUNTER — Encounter: Payer: 59 | Admitting: Gastroenterology

## 2017-10-09 ENCOUNTER — Encounter (HOSPITAL_COMMUNITY): Payer: Self-pay | Admitting: Gastroenterology

## 2017-10-29 ENCOUNTER — Ambulatory Visit: Payer: 59 | Admitting: Physician Assistant

## 2017-11-10 ENCOUNTER — Other Ambulatory Visit: Payer: Self-pay | Admitting: Physician Assistant

## 2017-11-10 DIAGNOSIS — I10 Essential (primary) hypertension: Secondary | ICD-10-CM

## 2017-11-10 DIAGNOSIS — E785 Hyperlipidemia, unspecified: Secondary | ICD-10-CM

## 2017-11-18 ENCOUNTER — Other Ambulatory Visit: Payer: Self-pay | Admitting: Physician Assistant

## 2017-11-18 ENCOUNTER — Encounter: Payer: Self-pay | Admitting: Physician Assistant

## 2017-11-18 ENCOUNTER — Ambulatory Visit: Payer: 59 | Admitting: Physician Assistant

## 2017-11-18 ENCOUNTER — Other Ambulatory Visit: Payer: Self-pay

## 2017-11-18 VITALS — BP 136/86 | HR 76 | Temp 98.2°F | Resp 18 | Ht 63.78 in | Wt 293.2 lb

## 2017-11-18 DIAGNOSIS — R202 Paresthesia of skin: Secondary | ICD-10-CM | POA: Diagnosis not present

## 2017-11-18 DIAGNOSIS — I10 Essential (primary) hypertension: Secondary | ICD-10-CM | POA: Diagnosis not present

## 2017-11-18 DIAGNOSIS — Z794 Long term (current) use of insulin: Secondary | ICD-10-CM | POA: Diagnosis not present

## 2017-11-18 DIAGNOSIS — R200 Anesthesia of skin: Secondary | ICD-10-CM

## 2017-11-18 DIAGNOSIS — G5601 Carpal tunnel syndrome, right upper limb: Secondary | ICD-10-CM

## 2017-11-18 DIAGNOSIS — E119 Type 2 diabetes mellitus without complications: Secondary | ICD-10-CM | POA: Diagnosis not present

## 2017-11-18 LAB — GLUCOSE, POCT (MANUAL RESULT ENTRY): POC Glucose: 307 mg/dl — AB (ref 70–99)

## 2017-11-18 MED ORDER — HYDROCHLOROTHIAZIDE 25 MG PO TABS: 25.0000 mg | ORAL_TABLET | Freq: Every day | ORAL | 6 refills | Status: DC

## 2017-11-18 NOTE — Progress Notes (Signed)
Virginia [name]  MRN: [id number] DOB: February 20, 1966  PCP: Dorise Hiss, PA-C  Subjective:  Pt is a 52 year old female PMH HTN, HLD and DM who presents to clinic for several complaints.  1) Right hand pain x 3 days. Started when she woke up from sleep. Describes sensation at tingling. Constant. Endorses some swelling which is improving. Denies pain, redness, increased warmth, skin changes. She has not taken anything to make it feel better. She had carpal tunnel a few years ago.   2) Medication refill of HCTZ. H/o HTN. Takes HCTZ  43m qd and Norvasc 155m Today's blood pressure is 142/94.  She has not taken her HCTZ today bc she is out.  Lifestyle changes note from last OV 09/2017: "Pt is working on making lifestyle changes. She is walking a lot more. Feeling good. She no longer has to sit down to rest when she gets home. Walks up her stairs without feeling winded. Making changes to her diet. Her daughter started juicing - she is drinking lemon and cyanne juice which has helped her LES swelling.  Potato chips are her downfall. She is interested in gastric bypass surgery in the future."  Review of Systems  Constitutional: Negative for chills and fever.  Musculoskeletal: Positive for arthralgias (right wrist and hand).  Skin: Negative.   Neurological: Positive for numbness. Negative for weakness.    Patient Active Problem List   Diagnosis Date Noted  . [place]  . Essential hypertension 02/08/2015    Current Outpatient Medications on File Prior to Visit  Medication Sig Dispense Refill  . amLODipine (NORVASC) 10 MG tablet TAKE 1 TABLET BY MOUTH DAILY 30 tablet 0  . atorvastatin (LIPITOR) 10 MG tablet TAKE 1 TABLET BY MOUTH DAILY 30 tablet 0  . Blood Glucose Monitoring Suppl (TRUE METRIX METER) W/DEVICE KIT Use as directed 1 kit 11  . glucose blood (TRUE METRIX BLOOD GLUCOSE TEST) test strip Use as instructed 100 each 12  . hydrochlorothiazide (HYDRODIURIL) 25  MG tablet Take 25 mg by mouth daily.    . Insulin Glargine (LANTUS SOLOSTAR) 100 UNIT/ML Solostar Pen Start with 10 units and increase by 2 units a day every 2 days if BS over 150. 5 pen PRN  . Insulin Pen Needle 32G X 6 MM MISC Inject 10 units daily at bed time. Advance 2 units every 2 days until a fasting blood sugar of 130. 100 each 11  . metFORMIN (GLUCOPHAGE) 500 MG tablet Take 1 tablet (500 mg total) by mouth 2 (two) times daily with a meal. 180 tablet 0  . TRUEPLUS LANCETS 26G MISC Use as directed! 100 each 11   No current facility-administered medications on file prior to visit.     No Known Allergies   Objective:  BP (!) 142/94 (BP Location: Left Arm, Patient Position: Sitting, Cuff Size: Large)   Pulse 76   Temp 98.2 F (36.8 C) (Oral)   Resp 18   Ht 5' 3.78" (1.62 m)   Wt 293 lb 3.2 oz (133 kg)   SpO2 98%   BMI 50.68 kg/m   Physical Exam  Constitutional: She is oriented to person, place, and time and well-developed, well-nourished, and in no distress. No distress.  Cardiovascular: Normal rate, regular rhythm and normal heart sounds.  Musculoskeletal:       Right hand: She exhibits swelling (mild swelling right hand). She exhibits normal range of motion and no tenderness. Normal strength noted.  Neurological: She is alert  and oriented to person, place, and time. GCS score is 15.  +tinel's sign. - Phalen's test  Skin: Skin is warm and dry.  Psychiatric: Mood, memory, affect and judgment normal.  Vitals reviewed.  Results for orders placed or performed in visit on 11/18/17  POCT glucose (manual entry)  Result Value Ref Range   POC Glucose 307 (A) 70 - 99 mg/dl    Assessment and Plan :  1. Essential hypertension - hydrochlorothiazide (HYDRODIURIL) 25 MG tablet; Take 1 tablet (25 mg total) by mouth daily.  Dispense: 30 tablet; Refill: 6 - Blood pressure today is 142/94. She has not yet taken HCTZ as she was out. OK to refill. Recheck in 1 month at annual exam.  2.  Numbness and tingling in right hand 3. Carpal tunnel syndrome of right wrist - pt c/o n/t and swelling of right wrist x 3 days. Suspect carpal tunnel syndrome. Her blood sugar today is >300. Will not give prednisone. Wrist splint applied to right wrist by CMA today. Advised night splinting. RTC in 1 month for annual exam and wrist pain f/u.  4. Type 2 diabetes mellitus without complication, with long-term current use of insulin (HCC) - POCT glucose (manual entry) - Blood sugar today > 300. Plan to check A1C in one month.   Mercer Pod, PA-C  [facility] 11/18/2017 8:40 AM

## 2017-11-18 NOTE — Patient Instructions (Addendum)
I am treating you today for Carpal Tunnel syndrome (see below).  Your blood sugar level today is 307. This is too high to receive steroid injection.  Wrist splints help a lot in treating carpal tunnel syndrome.   Splints are usually worn at night, but they can be worn continuously. Night splinting alone can reduce symptom severity and improve nerve conduction. Full-time splinting has been reported to improve nerve conduction, but it may not improve symptoms when compared with night-only splinting.   We will reevaluate your carpal tunnel when you come back for your annual exam next month. If needed (if you are not improving), we will refer you to neurology.    Carpal Tunnel Syndrome Carpal tunnel syndrome is a condition that causes pain in your hand and arm. The carpal tunnel is a narrow area located on the palm side of your wrist. Repeated wrist motion or certain diseases may cause swelling within the tunnel. This swelling pinches the main nerve in the wrist (median nerve). What are the causes? This condition may be caused by:  Repeated wrist motions.  Wrist injuries.  Arthritis.  A cyst or tumor in the carpal tunnel.  Fluid buildup during pregnancy.  Sometimes the cause of this condition is not known. What increases the risk? This condition is more likely to develop in:  People who have jobs that cause them to repeatedly move their wrists in the same motion, such as Art gallery manager.  Women.  People with certain conditions, such as: ? Diabetes. ? Obesity. ? An underactive thyroid (hypothyroidism). ? Kidney failure.  What are the signs or symptoms? Symptoms of this condition include:  A tingling feeling in your fingers, especially in your thumb, index, and middle fingers.  Tingling or numbness in your hand.  An aching feeling in your entire arm, especially when your wrist and elbow are bent for long periods of time.  Wrist pain that goes up your arm to your  shoulder.  Pain that goes down into your palm or fingers.  A weak feeling in your hands. You may have trouble grabbing and holding items.  Your symptoms may feel worse during the night. How is this diagnosed? This condition is diagnosed with a medical history and physical exam. You may also have tests, including:  An electromyogram (EMG). This test measures electrical signals sent by your nerves into the muscles.  X-rays.  How is this treated? Treatment for this condition includes:  Lifestyle changes. It is important to stop doing or modify the activity that caused your condition.  Physical or occupational therapy.  Medicines for pain and inflammation. This may include medicine that is injected into your wrist.  A wrist splint.  Surgery.  Follow these instructions at home: If you have a splint:  Wear it as told by your health care provider. Remove it only as told by your health care provider.  Loosen the splint if your fingers become numb and tingle, or if they turn cold and blue.  Keep the splint clean and dry. General instructions  Take over-the-counter and prescription medicines only as told by your health care provider.  Rest your wrist from any activity that may be causing your pain. If your condition is work related, talk to your employer about changes that can be made, such as getting a wrist pad to use while typing.  If directed, apply ice to the painful area: ? Put ice in a plastic bag. ? Place a towel between your skin and the  bag. ? Leave the ice on for 20 minutes, 2-3 times per day.  Keep all follow-up visits as told by your health care provider. This is important.  Do any exercises as told by your health care provider, physical therapist, or occupational therapist. Contact a health care provider if:  You have new symptoms.  Your pain is not controlled with medicines.  Your symptoms get worse. This information is not intended to replace advice given  to you by your health care provider. Make sure you discuss any questions you have with your health care provider. Document Released: 08/30/2000 Document Revised: 01/11/2016 Document Reviewed: 01/18/2015 Elsevier Interactive Patient Education  [employer].   Thank you for coming in today. I hope you feel we met your needs.  Feel free to call PCP if you have any questions or further requests.  Please consider signing up for MyChart if you do not already have it, as this is a great way to communicate with me.  Best,  Whitney McVey, PA-C  IF you received an x-ray today, you will receive an invoice from [facility]. Please contact [facility] at [phone] with questions or concerns regarding your invoice.   IF you received labwork today, you will receive an invoice from [place]. Please contact [facility] at [phone] with questions or concerns regarding your invoice.   Our billing staff will not be able to assist you with questions regarding bills from these companies.  You will be contacted with the lab results as soon as they are available. The fastest way to get your results is to activate your My Chart account. Instructions are located on the last page of this paperwork. If you have not heard from [place] regarding the results in 2 weeks, please contact this office.

## 2017-11-25 ENCOUNTER — Emergency Department (HOSPITAL_COMMUNITY)
Admission: EM | Admit: 2017-11-25 | Discharge: 2017-11-26 | Disposition: A | Discharge status: Home / Self Care | Payer: 59 | Attending: Emergency Medicine | Admitting: Emergency Medicine

## 2017-11-25 ENCOUNTER — Other Ambulatory Visit: Payer: Self-pay

## 2017-11-25 ENCOUNTER — Encounter (HOSPITAL_COMMUNITY): Payer: Self-pay | Admitting: Emergency Medicine

## 2017-11-25 DIAGNOSIS — I10 Essential (primary) hypertension: Secondary | ICD-10-CM | POA: Diagnosis not present

## 2017-11-25 DIAGNOSIS — E1165 Type 2 diabetes mellitus with hyperglycemia: Secondary | ICD-10-CM | POA: Diagnosis not present

## 2017-11-25 DIAGNOSIS — R739 Hyperglycemia, unspecified: Secondary | ICD-10-CM

## 2017-11-25 DIAGNOSIS — Z794 Long term (current) use of insulin: Secondary | ICD-10-CM | POA: Diagnosis not present

## 2017-11-25 DIAGNOSIS — Z79899 Other long term (current) drug therapy: Secondary | ICD-10-CM | POA: Insufficient documentation

## 2017-11-25 DIAGNOSIS — H81399 Other peripheral vertigo, unspecified ear: Secondary | ICD-10-CM | POA: Diagnosis not present

## 2017-11-25 DIAGNOSIS — E785 Hyperlipidemia, unspecified: Secondary | ICD-10-CM | POA: Insufficient documentation

## 2017-11-25 LAB — BASIC METABOLIC PANEL
Anion gap: 11 (ref 5–15)
BUN: 16 mg/dL (ref 6–20)
CHLORIDE: 98 mmol/L — AB (ref 101–111)
CO2: 24 mmol/L (ref 22–32)
CREATININE: 1.12 mg/dL — AB (ref 0.44–1.00)
Calcium: 9.3 mg/dL (ref 8.9–10.3)
GFR calc Af Amer: >60 mL/min (ref >60)
GFR calc non Af Amer: 55 mL/min — ABNORMAL LOW (ref >60)
GLUCOSE: 426 mg/dL — AB (ref 65–99)
POTASSIUM: 3.7 mmol/L (ref 3.5–5.1)
Sodium: 133 mmol/L — ABNORMAL LOW (ref 135–145)

## 2017-11-25 LAB — CBC
HEMATOCRIT: 40.6 % (ref 36.0–46.0)
Hemoglobin: 13.8 g/dL (ref 12.0–15.0)
MCH: 30.9 pg (ref 26.0–34.0)
MCHC: 34 g/dL (ref 30.0–36.0)
MCV: 90.8 fL (ref 78.0–100.0)
PLATELETS: 398 10*3/uL (ref 150–400)
RBC: 4.47 MIL/uL (ref 3.87–5.11)
RDW: 12.7 % (ref 11.5–15.5)
WBC: 11.5 10*3/uL — ABNORMAL HIGH (ref 4.0–10.5)

## 2017-11-25 LAB — URINALYSIS, ROUTINE W REFLEX MICROSCOPIC
BACTERIA UA: NONE SEEN
Bilirubin Urine: NEGATIVE
Glucose, UA: >=500 mg/dL — AB
Hgb urine dipstick: NEGATIVE
KETONES UR: NEGATIVE mg/dL
Leukocytes, UA: NEGATIVE
Nitrite: NEGATIVE
PH: 5 (ref 5.0–8.0)
Protein, ur: NEGATIVE mg/dL
SPECIFIC GRAVITY, URINE: 1.023 (ref 1.005–1.030)

## 2017-11-25 LAB — I-STAT BETA HCG BLOOD, ED (MC, WL, AP ONLY): I-stat hCG, quantitative: <5 m[IU]/mL (ref <5)

## 2017-11-25 NOTE — ED Triage Notes (Signed)
Pt reports she "feel off the wagon today" stating she "had a soda, took a bite of brownie then had a [place] ice tea." reports CBG is 387.   Pt reports she feels "shakie".

## 2017-11-26 LAB — CBG MONITORING, ED
GLUCOSE-CAPILLARY: 374 mg/dL — AB (ref 65–99)
GLUCOSE-CAPILLARY: 297 mg/dL — AB (ref 65–99)

## 2017-11-26 MED ORDER — INSULIN ASPART 100 UNIT/ML ~~LOC~~ SOLN
10.0000 [IU] | Freq: Once | SUBCUTANEOUS | Status: AC
  Administered 2017-11-26: 10 [IU] via SUBCUTANEOUS
  Filled 2017-11-26: qty 1

## 2017-11-26 MED ORDER — MECLIZINE HCL 25 MG PO TABS
25.0000 mg | ORAL_TABLET | Freq: Once | ORAL | Status: AC
  Administered 2017-11-26: 25 mg via ORAL
  Filled 2017-11-26: qty 1

## 2017-11-26 MED ORDER — MECLIZINE HCL 25 MG PO TABS: 25.0000 mg | ORAL_TABLET | Freq: Three times a day (TID) | ORAL | 0 refills | Status: DC | PRN

## 2017-11-26 NOTE — ED Notes (Signed)
Checked CBG 297, RN Mario informed

## 2017-11-26 NOTE — ED Provider Notes (Signed)
[facility] EMERGENCY DEPARTMENT Provider Note   CSN: [id number] Arrival date & time: 11/25/17  2116     History   Chief Complaint Chief Complaint  Patient presents with  . Hyperglycemia    HPI Virginia [name] is a 52 y.o. female.  The history is provided by the patient.  She has history of diabetes, hypertension, hyperlipidemia and comes in today because of generally not feeling well.  She states that she had a small piece of brown he and then went out with a friend to get some drinks.  She had a couple sips of her drinking started not feeling well.  She noted some nausea and felt a little dizzy.  She denies chest pain, heaviness, tightness, pressure.  She denies dyspnea or sweating.  Blood glucose at home has been generally running about 200 before today.  She has not noticed anything that seems to affect the way she feels.  Past Medical History:  Diagnosis Date  . Anemia   . Diabetes mellitus without complication ([place])   . Hyperlipemia   . Hypertension   . Thyroid disease     Patient Active Problem List   Diagnosis Date Noted  . Carpal tunnel syndrome of right wrist 11/18/2017  . Diabetes ([place]) 02/08/2015  . Essential hypertension 02/08/2015    Past Surgical History:  Procedure Laterality Date  . ABDOMINAL HYSTERECTOMY    . APPENDECTOMY    . COLONOSCOPY WITH PROPOFOL N/A 10/07/2017   Procedure: COLONOSCOPY WITH PROPOFOL;  Surgeon: Doran Stabler, MD;  Location: [facility];  Service: Gastroenterology;  Laterality: N/A;    OB History    No data available       Home Medications    Prior to Admission medications   Medication Sig Start Date End Date Taking? Authorizing Provider  amLODipine (NORVASC) 10 MG tablet TAKE 1 TABLET BY MOUTH DAILY 11/11/17   McVey, Gelene Mink, PA-C  atorvastatin (LIPITOR) 10 MG tablet TAKE 1 TABLET BY MOUTH DAILY 11/11/17   McVey, Gelene Mink, PA-C  Blood Glucose Monitoring Suppl (TRUE METRIX METER)  W/DEVICE KIT Use as directed 02/08/15   Micheline Chapman, NP  glucose blood (TRUE METRIX BLOOD GLUCOSE TEST) test strip Use as instructed 02/08/15   Micheline Chapman, NP  hydrochlorothiazide (HYDRODIURIL) 25 MG tablet Take 1 tablet (25 mg total) by mouth daily. 11/18/17   McVey, Gelene Mink, PA-C  Insulin Glargine (LANTUS SOLOSTAR) 100 UNIT/ML Solostar Pen Start with 10 units and increase by 2 units a day every 2 days if BS over 150. 08/08/17   McVey, Gelene Mink, PA-C  Insulin Pen Needle 32G X 6 MM MISC Inject 10 units daily at bed time. Advance 2 units every 2 days until a fasting blood sugar of 130. 08/08/17   McVey, Gelene Mink, PA-C  metFORMIN (GLUCOPHAGE) 500 MG tablet Take 1 tablet (500 mg total) by mouth 2 (two) times daily with a meal. 08/08/17   McVey, Gelene Mink, PA-C  TRUEPLUS LANCETS 26G MISC Use as directed! 02/08/15   Micheline Chapman, NP    Family History Family History  Problem Relation Age of Onset  . Heart disease Mother   . Hypertension Mother   . Stroke Father   . Colon cancer Neg Hx   . Esophageal cancer Neg Hx   . Pancreatic cancer Neg Hx   . Rectal cancer Neg Hx   . Stomach cancer Neg Hx     Social History Social History  Tobacco Use  . Smoking status: Never Smoker  . Smokeless tobacco: Never Used  Substance Use Topics  . Alcohol use: No  . Drug use: No     Allergies   Patient has no known allergies.   Review of Systems Review of Systems  All other systems reviewed and are negative.    Physical Exam Updated Vital Signs BP (!) 158/105 (BP Location: Right Arm)   Pulse (!) 109   Temp (!) 97.5 F (36.4 C) (Oral)   Resp 18   Ht 5' 3.75" (1.619 m)   Wt 134.7 kg (297 lb)   SpO2 100%   BMI 51.38 kg/m   Physical Exam  Nursing note and vitals reviewed.  52 year old female, resting comfortably and in no acute distress. Vital signs are significant for elevated blood pressure and heart rate. Oxygen saturation is 100%,  which is normal. Head is normocephalic and atraumatic. PERRLA, EOMI. Oropharynx is clear.  There is nystagmus at and lateral gaze which does fatigue. Neck is nontender and supple without adenopathy or JVD. Back is nontender and there is no CVA tenderness. Lungs are clear without rales, wheezes, or rhonchi. Chest is nontender. Heart has regular rate and rhythm without murmur. Abdomen is soft, flat, nontender without masses or hepatosplenomegaly and peristalsis is normoactive. Extremities have trace edema, full range of motion is present. Skin is warm and dry without rash. Neurologic: Mental status is normal, cranial nerves are intact, there are no motor or sensory deficits.  Dizziness and nausea are reproduced by passive head movement.  ED Treatments / Results  Labs (all labs ordered are listed, but only abnormal results are displayed) Labs Reviewed  BASIC METABOLIC PANEL - Abnormal; Notable for the following components:      Result Value   Sodium 133 (*)    Chloride 98 (*)    Glucose, Bld 426 (*)    Creatinine, Ser 1.12 (*)    GFR calc non Af Amer 55 (*)    All other components within normal limits  CBC - Abnormal; Notable for the following components:   WBC 11.5 (*)    All other components within normal limits  URINALYSIS, ROUTINE W REFLEX MICROSCOPIC - Abnormal; Notable for the following components:   Color, Urine STRAW (*)    APPearance HAZY (*)    Glucose, UA >=500 (*)    Squamous Epithelial / LPF 6-30 (*)    All other components within normal limits  CBG MONITORING, ED - Abnormal; Notable for the following components:   Glucose-Capillary 374 (*)    All other components within normal limits  CBG MONITORING, ED - Abnormal; Notable for the following components:   Glucose-Capillary 297 (*)    All other components within normal limits  I-STAT BETA HCG BLOOD, ED ([facility], [facility], [facility] ONLY)    Procedures Procedures  Medications Ordered in ED Medications  insulin aspart (novoLOG)  injection 10 Units (10 Units Subcutaneous Given 11/26/17 0235)  meclizine (ANTIVERT) tablet 25 mg (25 mg Oral Given 11/26/17 0233)     Initial Impression / Assessment and Plan / ED Course  I have reviewed the triage vital signs and the nursing notes.  Pertinent labs & imaging results that were available during my care of the patient were reviewed by me and considered in my medical decision making (see chart for details).  Dizziness and nausea which appear to be peripheral vertigo.  Hyperglycemia without evidence of ketoacidosis.  Anion gap is normal.  She is given a  dose of subcutaneous insulin and is given a dose of oral meclizine.  Old records are reviewed showing recent outpatient visit for hyperglycemia.  Glucose has come down under 300.  Dizziness has completely resolved following oral meclizine.  She is discharged with prescription for meclizine, follow-up with PCP as scheduled to work on control of hyperglycemia.  Final Clinical Impressions(s) / ED Diagnoses   Final diagnoses:  Peripheral vertigo, unspecified laterality  Hyperglycemia    ED Discharge Orders        Ordered    meclizine (ANTIVERT) 25 MG tablet  3 times daily PRN     03/09/75 2831       Delora Fuel, MD 51/76/16 [phone]

## 2017-12-02 ENCOUNTER — Ambulatory Visit: Payer: 59 | Admitting: Physician Assistant

## 2017-12-02 ENCOUNTER — Encounter: Payer: Self-pay | Admitting: Physician Assistant

## 2017-12-02 VITALS — BP 147/97 | HR 73 | Temp 98.9°F | Resp 17 | Ht 63.0 in | Wt 296.0 lb

## 2017-12-02 DIAGNOSIS — I10 Essential (primary) hypertension: Secondary | ICD-10-CM | POA: Diagnosis not present

## 2017-12-02 DIAGNOSIS — Z09 Encounter for follow-up examination after completed treatment for conditions other than malignant neoplasm: Secondary | ICD-10-CM

## 2017-12-02 DIAGNOSIS — E119 Type 2 diabetes mellitus without complications: Secondary | ICD-10-CM

## 2017-12-02 DIAGNOSIS — R42 Dizziness and giddiness: Secondary | ICD-10-CM | POA: Diagnosis not present

## 2017-12-02 DIAGNOSIS — R0683 Snoring: Secondary | ICD-10-CM

## 2017-12-02 DIAGNOSIS — Z794 Long term (current) use of insulin: Secondary | ICD-10-CM

## 2017-12-02 LAB — POCT GLYCOSYLATED HEMOGLOBIN (HGB A1C): Hemoglobin A1C: 11.8

## 2017-12-02 LAB — POCT URINALYSIS DIP (MANUAL ENTRY)
Bilirubin, UA: NEGATIVE
Blood, UA: NEGATIVE
Glucose, UA: 100 mg/dL — AB
Ketones, POC UA: NEGATIVE mg/dL
Leukocytes, UA: NEGATIVE
Nitrite, UA: NEGATIVE
Protein Ur, POC: NEGATIVE mg/dL
Spec Grav, UA: 1.025 (ref 1.010–1.025)
Urobilinogen, UA: 1 U/dL
pH, UA: 5.5 (ref 5.0–8.0)

## 2017-12-02 LAB — POCT CBC
Granulocyte percent: 54 % (ref 37–80)
HCT, POC: 40.6 % (ref 37.7–47.9)
Hemoglobin: 13 g/dL (ref 12.2–16.2)
Lymph, poc: 3.2 (ref 0.6–3.4)
MCH, POC: 29 pg (ref 27–31.2)
MCHC: 32.1 g/dL (ref 31.8–35.4)
MCV: 90.4 fL (ref 80–97)
MID (cbc): 0.2 (ref 0–0.9)
MPV: 7.5 fL (ref 0–99.8)
POC Granulocyte: 4 (ref 2–6.9)
POC LYMPH PERCENT: 43.8 % (ref 10–50)
POC MID %: 2.2 % (ref 0–12)
Platelet Count, POC: 405 10*3/uL (ref 142–424)
RBC: 4.49 M/uL (ref 4.04–5.48)
RDW, POC: 13 %
WBC: 7.4 10*3/uL (ref 4.6–10.2)

## 2017-12-02 LAB — GLUCOSE, POCT (MANUAL RESULT ENTRY): POC Glucose: 194 mg/dL — AB (ref 70–99)

## 2017-12-02 MED ORDER — HYDROCHLOROTHIAZIDE 50 MG PO TABS: 50.0000 mg | ORAL_TABLET | Freq: Every day | ORAL | 11 refills | Status: AC

## 2017-12-02 MED ORDER — METFORMIN HCL 1000 MG PO TABS: 1000.0000 mg | ORAL_TABLET | Freq: Two times a day (BID) | ORAL | 11 refills | Status: DC

## 2017-12-02 NOTE — Progress Notes (Signed)
Virginia [name]  MRN: [id number] DOB: [birth year]  PCP: Dorise Hiss, PA-C  Subjective:  Pt is a 51 year old female PMH diabetes, hypertension, hyperlipidemia who presents to clinic for follow-up dizziness. She was in the hospital 3/12 for dizziness. Hyperglycemia without evidence of ketoacidosis. Dx with peripheral vertigo, Rx Meclizine. Meclinzine does not seem to be helping.  She has not been controlling her blood sugars recently. Home sugars are > 200.  Endorses snoring. Wakes up not feeling rested. Denies excessive daytime fatigue.  Review of Systems  Neurological: Positive for dizziness.    Patient Active Problem List   Diagnosis Date Noted  . Carpal tunnel syndrome of right wrist 11/18/2017  . Diabetes ([place]) 02/08/2015  . Essential hypertension 02/08/2015    Current Outpatient Medications on File Prior to Visit  Medication Sig Dispense Refill  . amLODipine (NORVASC) 10 MG tablet TAKE 1 TABLET BY MOUTH DAILY 30 tablet 0  . amoxicillin (AMOXIL) 500 MG capsule Take 500 mg by mouth daily as needed.  10  . Blood Glucose Monitoring Suppl (TRUE METRIX METER) W/DEVICE KIT Use as directed 1 kit 11  . glucose blood (TRUE METRIX BLOOD GLUCOSE TEST) test strip Use as instructed 100 each 12  . hydrochlorothiazide (HYDRODIURIL) 25 MG tablet Take 1 tablet (25 mg total) by mouth daily. 30 tablet 6  . Insulin Glargine (LANTUS SOLOSTAR) 100 UNIT/ML Solostar Pen Start with 10 units and increase by 2 units a day every 2 days if BS over 150. (Patient taking differently: Inject 65 Units into the skin daily at 10 pm. ) 5 pen PRN  . Insulin Pen Needle 32G X 6 MM MISC Inject 10 units daily at bed time. Advance 2 units every 2 days until a fasting blood sugar of 130. 100 each 11  . metFORMIN (GLUCOPHAGE) 500 MG tablet Take 1 tablet (500 mg total) by mouth 2 (two) times daily with a meal. 180 tablet 0  . TRUEPLUS LANCETS 26G MISC Use as directed! 100 each 11  . meclizine (ANTIVERT) 25 MG  tablet Take 1 tablet (25 mg total) by mouth 3 (three) times daily as needed for dizziness. (Patient not taking: Reported on 12/02/2017) 30 tablet 0   No current facility-administered medications on file prior to visit.     No Known Allergies   Objective:  BP (!) 147/97   Pulse 73   Temp 98.9 F (37.2 C) (Oral)   Resp 17   Ht _0  (1.6 m)   Wt 296 lb (134.3 kg)   SpO2 98%   BMI 52.43 kg/m   Physical Exam  Constitutional: She is oriented to person, place, and time and well-developed, well-nourished, and in no distress. No distress.  Cardiovascular: Normal rate, regular rhythm and normal heart sounds.  Pulmonary/Chest: Effort normal and breath sounds normal. She has no wheezes. She has no rales.  Neurological: She is alert and oriented to person, place, and time. GCS score is 15.  Skin: Skin is warm and dry.  Psychiatric: Mood, memory, affect and judgment normal.  Vitals reviewed.  Results for orders placed or performed in visit on 12/02/17  POCT glycosylated hemoglobin (Hb A1C)  Result Value Ref Range   Hemoglobin A1C 11.8   POCT urinalysis dipstick  Result Value Ref Range   Color, UA yellow yellow   Clarity, UA clear clear   Glucose, UA =100 (A) negative mg/dL   Bilirubin, UA negative negative   Ketones, POC UA negative negative mg/dL   Spec  Grav, UA 1.025 1.010 - 1.025   Blood, UA negative negative   pH, UA 5.5 5.0 - 8.0   Protein Ur, POC negative negative mg/dL   Urobilinogen, UA 1.0 0.2 or 1.0 E.U./dL   Nitrite, UA Negative Negative   Leukocytes, UA Negative Negative  POCT glucose (manual entry)  Result Value Ref Range   POC Glucose 194 (A) 70 - 99 mg/dl  POCT CBC  Result Value Ref Range   WBC 7.4 4.6 - 10.2 K/uL   Lymph, poc 3.2 0.6 - 3.4   POC LYMPH PERCENT 43.8 10 - 50 %L   MID (cbc) 0.2 0 - 0.9   POC MID % 2.2 0 - 12 %M   POC Granulocyte 4.0 2 - 6.9   Granulocyte percent 54.0 37 - 80 %G   RBC 4.49 4.04 - 5.48 M/uL   Hemoglobin 13.0 12.2 - 16.2 g/dL    HCT, POC 40.6 37.7 - 47.9 %   MCV 90.4 80 - 97 fL   MCH, POC 29.0 27 - 31.2 pg   MCHC 32.1 31.8 - 35.4 g/dL   RDW, POC 13.0 %   Platelet Count, POC 405 142 - 424 K/uL   MPV 7.5 0 - 99.8 fL    Assessment and Plan :  1. Dizziness - POCT glycosylated hemoglobin (Hb A1C) - POCT urinalysis dipstick - POCT glucose (manual entry) - POCT CBC - Pt here for f/u dizziness. She was seen in the emergency department 7 days ago for the same c/c and dx with hyperglycemia and BPPV. Suspect blood sugar levels are contributing to her symptoms. Blood sugar today is 194, no ketones in her urine. A1C is 11.8. Discussed need for better sugar control. Plan to increase Metformin dose to 1,042m bid. con't checking home blood sugars. RTC in 4-6 weeks for recheck. con't meclizine as needed for dizziness.  2. Type 2 diabetes mellitus without complication, with long-term current use of insulin (HCC) - metFORMIN (GLUCOPHAGE) 1000 MG tablet; Take 1 tablet (1,000 mg total) by mouth 2 (two) times daily with a meal.  Dispense: 60 tablet; Refill: 11 3. Essential hypertension - hydrochlorothiazide (HYDRODIURIL) 50 MG tablet; Take 1 tablet (50 mg total) by mouth daily.  Dispense: 30 tablet; Refill: 11 - plan to increase HCTZ to 541mqd.  4. Snoring - Ambulatory referral to Sleep Studies  5. Encounter for examination following treatment at hospital  WhMercer PodPA-C  [facility] [place]/19/2019 4:41 PM

## 2017-12-02 NOTE — Patient Instructions (Addendum)
If you start to have dizzy spells again google "Epley maneuver" and attempt this. As always, come back and see [place] if you are concerned/do not improve.   Start taking metformin 1,060m twice daily. Continue checking home blood sugars.  Start taking HCTZ 522mdaily instead of 2556maily.  Come back and see me in 4-8 weeks for recheck.   You will receive a phone call to schedule an appointment for a sleep study. (see below)  Sleep Studies A sleep study (polysomnogram) is a series of tests done while you are sleeping. It can show how well you sleep. This can help your health care provider diagnose a sleep disorder and show how severe your sleep disorder is. A sleep study may lead to treatment that will help you sleep better and prevent other medical problems caused by poor sleep. If you have a sleep disorder, you may also be at risk for:  Sleep-related accidents.  High blood pressure.  Heart disease.  Stroke.  Other medical conditions.  Sleep disorders are common. Your health care provider may suspect a sleep disorder if you:  Have loud snoring most nights.  Have brief periods when you stop breathing at night.  Feel sleepy on most days.  Fall asleep suddenly during the day.  Have trouble falling asleep or staying asleep.  Feel like you need to move your legs when trying to fall asleep.  Have dreams that seem very real shortly after falling asleep.  Feel like you cannot move when you first wake up.  Which tests will I need to have? Most sleep studies last all night and include these tests:  Recordings of your brain activity.  Recordings of your eye movements.  Recording of your heart rate and rhythm.  Blood pressure readings.  Readings of the amount of oxygen in your blood.  Measurements of your chest and belly movement as you breathe during sleep.  If you have signs of the sleep disorder called sleep apnea during your test, you may get a mask to wear for the second  half of the night.  The mask provides continuous positive airway pressure (CPAP). This may improve sleep apnea significantly.  You will then have all tests done again with the mask in place to see if your measurements and recordings change.  How are sleep studies done? Most sleep studies are done over one full night of sleep.  You will arrive at the study center in the evening and can go home in the morning.  Bring your pajamas and toothbrush.  Do not have caffeine on the day of your sleep study.  Your health care provider will let you know if you need to stop taking any of your regular medicines before the test.  To do the tests included in a polysomnogram, you will have:  Round, sticky patches with sensors attached to recording wires (electrodes) placed on your scalp, face, chest, and limbs.  Wires from all the electrodes and sensors run from your bed to a computer. The wires can be taken off and put back on if you need to get out of bed to go to the bathroom.  A sensor placed over your nose to measure airflow.  A finger clip put on one finger to measure your blood oxygen level.  A belt around your belly and a belt around your chest to measure breathing movements.  Where are sleep studies done? Sleep studies are done at sleep centers. A sleep center may be inside a hospital,  office, or clinic. The room where you have the study may look like a hospital room or a hotel room. The health care providers doing the study may come in and out of the room during the study. Most of the time, they will be in another room monitoring your test. How is information from sleep studies helpful? A polysomnogram can be used along with your medical history and a physical exam to diagnose conditions, such as:  Sleep apnea.  Restless legs syndrome.  Sleep-related seizure disorders.  Sleep-related movement disorders.  A medical doctor who specializes in sleep will evaluate your sleep study. The  specialist will share the results with your primary health care provider. Treatments based on your sleep study may include:  Improving your sleep habits (sleep hygiene).  Wearing a CPAP mask.  Wearing an oral device at night to improve breathing and reduce snoring.  Taking medicine for: ? Restless legs syndrome. ? Sleep-related seizure disorder. ? Sleep-related movement disorder.  ################################################################## For constipation  Look up "6 Reasons to Care about [place]" at [place] (WindowBlog.ch)  1) Water: Make sure you are drinking enough water daily - about 1-3 liters. 2) Fiber: Make sure you are getting enough fiber in your diet - this will make you regular - you can eat high fiber foods or use metamucil as a supplement - it is really important to drink enough water when using fiber supplements. Foods that have a lot of fiber include vegetables, fruits, beans, nuts, oatmeal, and some breads and cereals. You can tell how much fiber is in a food by reading the nutrition label. Doctors recommend eating 25 to 36 grams of fiber each day. 3) Fitness: Increasing your physical activity will help increase the natural movement of your bowels. Try to get 20-30 minutes of exercise daily.  4) Squatty Potty. Use a 9" stool in front of your toilet to rest your feet on while you have a bowel movement. This will loosen your rectal muscles to help your stool come out easier and prevent straining.  5) Try a probiotic supplement daily.   If your stools are hard or are formed balls or you have to strain a stool softener will help - use colace 2-3 capsule a day  Option 1) For gentle treatment of constipation Use Miralax 1-2 capfuls a day until your stools are soft and regular and then decrease the usage - you can use this daily  Option 2) For more aggressive treatment of constipation Use 4 capfuls of Colace and 6 doses of  Miralax and drink it in 2 hours - this should result in several watery stools - if it does not repeat the next day and then go to daily miralax for a week to make sure your bowels are clean and retrained to work properly  Thank you for coming in today. I hope you feel we met your needs. Feel free to call PCP if you have any questions or further requests. Please consider signing up for MyChart if you do not already have it, as this is a great way to communicate with me.  Best,  Whitney McVey, PA-C   IF you received an x-ray today, you will receive an invoice from [facility]. Please contact [facility] at [phone] with questions or concerns regarding your invoice.   IF you received labwork today, you will receive an invoice from [facility]. Please contact [facility] at [phone] with questions or concerns regarding your invoice.   Our billing staff will  not be able to assist you with questions regarding bills from these companies.  You will be contacted with the lab results as soon as they are available. The fastest way to get your results is to activate your My Chart account. Instructions are located on the last page of this paperwork. If you have not heard from [place] regarding the results in 2 weeks, please contact this office.

## 2017-12-06 ENCOUNTER — Other Ambulatory Visit: Payer: Self-pay | Admitting: Physician Assistant

## 2017-12-06 DIAGNOSIS — E119 Type 2 diabetes mellitus without complications: Secondary | ICD-10-CM

## 2017-12-06 DIAGNOSIS — Z794 Long term (current) use of insulin: Principal | ICD-10-CM

## 2017-12-10 ENCOUNTER — Other Ambulatory Visit: Payer: Self-pay | Admitting: Physician Assistant

## 2017-12-10 DIAGNOSIS — E785 Hyperlipidemia, unspecified: Secondary | ICD-10-CM

## 2017-12-10 DIAGNOSIS — I10 Essential (primary) hypertension: Secondary | ICD-10-CM

## 2017-12-12 ENCOUNTER — Other Ambulatory Visit: Payer: Self-pay | Admitting: Physician Assistant

## 2017-12-12 DIAGNOSIS — E785 Hyperlipidemia, unspecified: Secondary | ICD-10-CM

## 2017-12-24 ENCOUNTER — Other Ambulatory Visit: Payer: Self-pay

## 2017-12-24 ENCOUNTER — Encounter: Payer: Self-pay | Admitting: Physician Assistant

## 2017-12-24 ENCOUNTER — Ambulatory Visit: Payer: 59 | Admitting: Physician Assistant

## 2017-12-24 VITALS — BP 140/96 | HR 89 | Temp 98.4°F | Resp 16 | Ht 62.5 in | Wt 296.2 lb

## 2017-12-24 DIAGNOSIS — Z130 Encounter for screening for diseases of the blood and blood-forming organs and certain disorders involving the immune mechanism: Secondary | ICD-10-CM

## 2017-12-24 DIAGNOSIS — D1723 Benign lipomatous neoplasm of skin and subcutaneous tissue of right leg: Secondary | ICD-10-CM

## 2017-12-24 DIAGNOSIS — Z0000 Encounter for general adult medical examination without abnormal findings: Secondary | ICD-10-CM

## 2017-12-24 DIAGNOSIS — Z13220 Encounter for screening for lipoid disorders: Secondary | ICD-10-CM

## 2017-12-24 DIAGNOSIS — Z13228 Encounter for screening for other metabolic disorders: Secondary | ICD-10-CM

## 2017-12-24 DIAGNOSIS — Z01419 Encounter for gynecological examination (general) (routine) without abnormal findings: Secondary | ICD-10-CM

## 2017-12-24 DIAGNOSIS — E119 Type 2 diabetes mellitus without complications: Secondary | ICD-10-CM

## 2017-12-24 DIAGNOSIS — Z1329 Encounter for screening for other suspected endocrine disorder: Secondary | ICD-10-CM

## 2017-12-24 DIAGNOSIS — Z1321 Encounter for screening for nutritional disorder: Secondary | ICD-10-CM | POA: Diagnosis not present

## 2017-12-24 DIAGNOSIS — Z124 Encounter for screening for malignant neoplasm of cervix: Secondary | ICD-10-CM

## 2017-12-24 DIAGNOSIS — Z794 Long term (current) use of insulin: Secondary | ICD-10-CM

## 2017-12-24 LAB — POCT URINALYSIS DIP (MANUAL ENTRY)
Bilirubin, UA: NEGATIVE
Blood, UA: NEGATIVE
Glucose, UA: NEGATIVE mg/dL
Ketones, POC UA: NEGATIVE mg/dL
Leukocytes, UA: NEGATIVE
Nitrite, UA: NEGATIVE
Protein Ur, POC: NEGATIVE mg/dL
Spec Grav, UA: >=1.03 — AB (ref 1.010–1.025)
Urobilinogen, UA: 0.2 E.U./dL
pH, UA: 5.5 (ref 5.0–8.0)

## 2017-12-24 LAB — POCT GLYCOSYLATED HEMOGLOBIN (HGB A1C): Hemoglobin A1C: 9.9

## 2017-12-24 MED ORDER — INSULIN GLARGINE 100 UNIT/ML SOLOSTAR PEN: 65.0000 [IU] | PEN_INJECTOR | Freq: Every day | SUBCUTANEOUS | 6 refills | Status: DC

## 2017-12-24 NOTE — Patient Instructions (Addendum)
Keep up the great work!! It's always a pleasure to see you.  Your A1C today is 9.9. Last time it was 11.8. Come back and see me in 6 months for sugar check.   DR. Gladstone Lighter GUIDE TO HAPPY AND HEALTHY LIVING These are some of my general health and wellness recommendations. Some of them may apply to you better than others. Please use common sense as you try these suggestions and feel free to ask me any questions.   ACTIVITY/FITNESS Mental, social, emotional and physical stimulation are very important for brain and body health. Try learning a new activity (arts, music, language, sports, games).  Keep moving your body to the best of your abilities. You can do this at home, inside or outside, the park, community center, gym or anywhere you like. Consider a physical therapist or personal trainer to get started. Consider the app Sworkit. Fitness trackers such as smart-watches, smart-phones or Fitbits can help as well.   NUTRITION Eat more plants: colorful vegetables, nuts, seeds and berries. Eat less sugar, salt, preservatives and processed foods. Drink water when you are thirsty. Warm water with a slice of lemon is an excellent morning drink to start the day. Consider these websites for more information The Nutrition Source (https://www.henry-hernandez.biz/) Precision Nutrition (WindowBlog.ch)   RELAXATION Consider practicing mindfulness meditation or other relaxation techniques such as deep breathing, prayer, yoga, tai chi, massage. See website mindful.org or the apps Headspace or Calm to help get started.   SLEEP Try to get at least 7-8+ hours sleep per day. Regular exercise and reduced caffeine will help you sleep better. Practice good sleep hygeine techniques. See website sleep.org for more information.   PLANNING Prepare estate planning, living will, healthcare POA documents. Sometimes this is best planned with the help of an  attorney. Theconversationproject.org and agingwithdignity.org are excellent resources.  Health Maintenance, Female Adopting a healthy lifestyle and getting preventive care can go a long way to promote health and wellness. Talk with your health care provider about what schedule of regular examinations is right for you. This is a good chance for you to check in with your provider about disease prevention and staying healthy. In between checkups, there are plenty of things you can do on your own. Experts have done a lot of research about which lifestyle changes and preventive measures are most likely to keep you healthy. Ask your health care provider for more information. Weight and diet Eat a healthy diet  Be sure to include plenty of vegetables, fruits, low-fat dairy products, and lean protein.  Do not eat a lot of foods high in solid fats, added sugars, or salt.  Get regular exercise. This is one of the most important things you can do for your health. ? Most adults should exercise for at least 150 minutes each week. The exercise should increase your heart rate and make you sweat (moderate-intensity exercise). ? Most adults should also do strengthening exercises at least twice a week. This is in addition to the moderate-intensity exercise.  Maintain a healthy weight  Body mass index (BMI) is a measurement that can be used to identify possible weight problems. It estimates body fat based on height and weight. Your health care provider can help determine your BMI and help you achieve or maintain a healthy weight.  For females 85 years of age and older: ? A BMI below 18.5 is considered underweight. ? A BMI of 18.5 to 24.9 is normal. ? A BMI of 25 to 29.9 is considered  overweight. ? A BMI of 30 and above is considered obese.  Watch levels of cholesterol and blood lipids  You should start having your blood tested for lipids and cholesterol at 52 years of age, then have this test every 5  years.  You may need to have your cholesterol levels checked more often if: ? Your lipid or cholesterol levels are high. ? You are older than 52 years of age. ? You are at high risk for heart disease.  Cancer screening Lung Cancer  Lung cancer screening is recommended for adults 54-28 years old who are at high risk for lung cancer because of a history of smoking.  A yearly low-dose CT scan of the lungs is recommended for people who: ? Currently smoke. ? Have quit within the past 15 years. ? Have at least a 30-pack-year history of smoking. A pack year is smoking an average of one pack of cigarettes a day for 1 year.  Yearly screening should continue until it has been 15 years since you quit.  Yearly screening should stop if you develop a health problem that would prevent you from having lung cancer treatment.  Breast Cancer  Practice breast self-awareness. This means understanding how your breasts normally appear and feel.  It also means doing regular breast self-exams. Let your health care provider know about any changes, no matter how small.  If you are in your 20s or 30s, you should have a clinical breast exam (CBE) by a health care provider every 1-3 years as part of a regular health exam.  If you are 33 or older, have a CBE every year. Also consider having a breast X-ray (mammogram) every year.  If you have a family history of breast cancer, talk to your health care provider about genetic screening.  If you are at high risk for breast cancer, talk to your health care provider about having an MRI and a mammogram every year.  Breast cancer gene (BRCA) assessment is recommended for women who have family members with BRCA-related cancers. BRCA-related cancers include: ? Breast. ? Ovarian. ? Tubal. ? Peritoneal cancers.  Results of the assessment will determine the need for genetic counseling and BRCA1 and BRCA2 testing.  Cervical Cancer Your health care provider may  recommend that you be screened regularly for cancer of the pelvic organs (ovaries, uterus, and vagina). This screening involves a pelvic examination, including checking for microscopic changes to the surface of your cervix (Pap test). You may be encouraged to have this screening done every 3 years, beginning at age 29.  For women ages 65-65, health care providers may recommend pelvic exams and Pap testing every 3 years, or they may recommend the Pap and pelvic exam, combined with testing for human papilloma virus (HPV), every 5 years. Some types of HPV increase your risk of cervical cancer. Testing for HPV may also be done on women of any age with unclear Pap test results.  Other health care providers may not recommend any screening for nonpregnant women who are considered low risk for pelvic cancer and who do not have symptoms. Ask your health care provider if a screening pelvic exam is right for you.  If you have had past treatment for cervical cancer or a condition that could lead to cancer, you need Pap tests and screening for cancer for at least 20 years after your treatment. If Pap tests have been discontinued, your risk factors (such as having a new sexual partner) need to be reassessed to  determine if screening should resume. Some women have medical problems that increase the chance of getting cervical cancer. In these cases, your health care provider may recommend more frequent screening and Pap tests.  Colorectal Cancer  This type of cancer can be detected and often prevented.  Routine colorectal cancer screening usually begins at 52 years of age and continues through 52 years of age.  Your health care provider may recommend screening at an earlier age if you have risk factors for colon cancer.  Your health care provider may also recommend using home test kits to check for hidden blood in the stool.  A small camera at the end of a tube can be used to examine your colon directly  (sigmoidoscopy or colonoscopy). This is done to check for the earliest forms of colorectal cancer.  Routine screening usually begins at age 61.  Direct examination of the colon should be repeated every 5-10 years through 52 years of age. However, you may need to be screened more often if early forms of precancerous polyps or small growths are found.  Skin Cancer  Check your skin from head to toe regularly.  Tell your health care provider about any new moles or changes in moles, especially if there is a change in a mole's shape or color.  Also tell your health care provider if you have a mole that is larger than the size of a pencil eraser.  Always use sunscreen. Apply sunscreen liberally and repeatedly throughout the day.  Protect yourself by wearing long sleeves, pants, a wide-brimmed hat, and sunglasses whenever you are outside.  Heart disease, diabetes, and high blood pressure  High blood pressure causes heart disease and increases the risk of stroke. High blood pressure is more likely to develop in: ? People who have blood pressure in the high end of the normal range (130-139/85-89 mm Hg). ? People who are overweight or obese. ? People who are African American.  If you are 23-68 years of age, have your blood pressure checked every 3-5 years. If you are 90 years of age or older, have your blood pressure checked every year. You should have your blood pressure measured twice-once when you are at a hospital or clinic, and once when you are not at a hospital or clinic. Record the average of the two measurements. To check your blood pressure when you are not at a hospital or clinic, you can use: ? An automated blood pressure machine at a pharmacy. ? A home blood pressure monitor.  If you are between 92 years and 81 years old, ask your health care provider if you should take aspirin to prevent strokes.  Have regular diabetes screenings. This involves taking a blood sample to check your  fasting blood sugar level. ? If you are at a normal weight and have a low risk for diabetes, have this test once every three years after 52 years of age. ? If you are overweight and have a high risk for diabetes, consider being tested at a younger age or more often. Preventing infection Hepatitis B  If you have a higher risk for hepatitis B, you should be screened for this virus. You are considered at high risk for hepatitis B if: ? You were born in a country where hepatitis B is common. Ask your health care provider which countries are considered high risk. ? Your parents were born in a high-risk country, and you have not been immunized against hepatitis B (hepatitis B vaccine). ?  You have HIV or AIDS. ? You use needles to inject street drugs. ? You live with someone who has hepatitis B. ? You have had sex with someone who has hepatitis B. ? You get hemodialysis treatment. ? You take certain medicines for conditions, including cancer, organ transplantation, and autoimmune conditions.  Hepatitis C  Blood testing is recommended for: ? Everyone born from [birth year] through 1965. ? Anyone with known risk factors for hepatitis C.  Sexually transmitted infections (STIs)  You should be screened for sexually transmitted infections (STIs) including gonorrhea and chlamydia if: ? You are sexually active and are younger than 52 years of age. ? You are older than 52 years of age and your health care provider tells you that you are at risk for this type of infection. ? Your sexual activity has changed since you were last screened and you are at an increased risk for chlamydia or gonorrhea. Ask your health care provider if you are at risk.  If you do not have HIV, but are at risk, it may be recommended that you take a prescription medicine daily to prevent HIV infection. This is called pre-exposure prophylaxis (PrEP). You are considered at risk if: ? You are sexually active and do not regularly use condoms  or know the HIV status of your partner(s). ? You take drugs by injection. ? You are sexually active with a partner who has HIV.  Talk with your health care provider about whether you are at high risk of being infected with HIV. If you choose to begin PrEP, you should first be tested for HIV. You should then be tested every 3 months for as long as you are taking PrEP. Pregnancy  If you are premenopausal and you may become pregnant, ask your health care provider about preconception counseling.  If you may become pregnant, take 400 to 800 micrograms (mcg) of folic acid every day.  If you want to prevent pregnancy, talk to your health care provider about birth control (contraception). Osteoporosis and menopause  Osteoporosis is a disease in which the bones lose minerals and strength with aging. This can result in serious bone fractures. Your risk for osteoporosis can be identified using a bone density scan.  If you are 26 years of age or older, or if you are at risk for osteoporosis and fractures, ask your health care provider if you should be screened.  Ask your health care provider whether you should take a calcium or vitamin D supplement to lower your risk for osteoporosis.  Menopause may have certain physical symptoms and risks.  Hormone replacement therapy may reduce some of these symptoms and risks. Talk to your health care provider about whether hormone replacement therapy is right for you. Follow these instructions at home:  Schedule regular health, dental, and eye exams.  Stay current with your immunizations.  Do not use any tobacco products including cigarettes, chewing tobacco, or electronic cigarettes.  If you are pregnant, do not drink alcohol.  If you are breastfeeding, limit how much and how often you drink alcohol.  Limit alcohol intake to no more than 1 drink per day for nonpregnant women. One drink equals 12 ounces of beer, 5 ounces of wine, or 1 ounces of hard  liquor.  Do not use street drugs.  Do not share needles.  Ask your health care provider for help if you need support or information about quitting drugs.  Tell your health care provider if you often feel depressed.  Tell your  health care provider if you have ever been abused or do not feel safe at home. This information is not intended to replace advice given to you by your health care provider. Make sure you discuss any questions you have with your health care provider. Document Released: 03/18/2011 Document Revised: 02/08/2016 Document Reviewed: 06/06/2015 Elsevier Interactive Patient Education  2018 [employer].  IF you received an x-ray today, you will receive an invoice from [facility]. Please contact [facility] at [phone] with questions or concerns regarding your invoice.   IF you received labwork today, you will receive an invoice from [facility]. Please contact [facility] at [phone] with questions or concerns regarding your invoice.   Our billing staff will not be able to assist you with questions regarding bills from these companies.  You will be contacted with the lab results as soon as they are available. The fastest way to get your results is to activate your My Chart account. Instructions are located on the last page of this paperwork. If you have not heard from [place] regarding the results in 2 weeks, please contact this office.

## 2017-12-24 NOTE — Progress Notes (Signed)
[facility] [place], [place] [address] [phone]  Date:  12/24/2017   Name:  Virginia [name]   DOB:  08/09/1966   MRN:  [id number]  PCP:  Dorise Hiss, PA-C    Chief Complaint: Annual Exam (w/pap) and knot (in right thigh x 1 year,  " seems to have gotten bigger, sensitive to the touch")   History of Present Illness:  This is a 52 y.o. female with PMH HTN, DM, obesity, carpal tunnel syndrome who is presenting for CPE.   DM - uncontrolled. A1C one month ago 11.8. At that time, increased Metformin to 1,000 mg bid. She is on Insulin. She went to DM nutrition class in 2016. She still does not want to start another DM medication. Sugars are between 160-180. Always are below 300. Rarely in 200's.   HTN - Increased medication one month ago to HCTZ 7m qd. Amlodipine 183m  Blood pressure today is 140/96. At [employer] she goes 4 days/week. Goes with daughter. Takes Zumba class. Had a fitness profile done - Does this once a month. "I'm still working on my eating". She is doing an onMultimedia programmer0-day challenge for eating plan. She is logging foods. Staying at 2,000 calories/day.   HLD - atorvastatin 10 mg qd.   Snoring - she was referred to sleep studies last month. She has appt next month.   Complaints: she is trying to kick her live-in partner out. They have been living together > 2 years. "Anytime you don't say good morning or good bye to each other anymore, it's time to say good bye".  Lump on left anterior right thigh. Has been there a long time. Recently grown in size. Hurts when she bumps it.    LMP: n/a  Last pap: overdue. Last PAP maybe around 2006.  Sexual history: not active x 1 year. She has a live-in partner.  Immunizations: needs  Eye: 20/20 Diet/Exercise: see above Fam hx: family history includes Heart disease in her mother; Hypertension in her mother; Stroke in her father.  Tobacco/alcohol/substance use: never smoker  Mammogram:   09/17/2017. No malignancy. Next screening mammogram in one year Colonoscopy: 10/07/2017. Next screening in 10 years.    Review of Systems:  Review of Systems  Constitutional: Negative for chills, diaphoresis, fatigue and fever.  HENT: Negative for congestion, postnasal drip, rhinorrhea, sinus pressure, sneezing and sore throat.   Respiratory: Negative for cough, chest tightness, shortness of breath and wheezing.   Cardiovascular: Negative for chest pain and palpitations.  Gastrointestinal: Negative for abdominal pain, diarrhea, nausea and vomiting.  Genitourinary: Negative for decreased urine volume, difficulty urinating, dysuria, enuresis, flank pain, frequency, hematuria and urgency.  Musculoskeletal: Negative for back pain.  Neurological: Negative for dizziness, weakness, light-headedness and headaches.    Patient Active Problem List   Diagnosis Date Noted  . Carpal tunnel syndrome of right wrist 11/18/2017  . Diabetes ([place]05/25/2016  . Essential hypertension 02/08/2015    Prior to Admission medications   Medication Sig Start Date End Date Taking? Authorizing Provider  amLODipine (NORVASC) 10 MG tablet TAKE 1 TABLET BY MOUTH DAILY 12/10/17  Yes Jaqwan Wieber, ElGelene MinkPA-C  amoxicillin (AMOXIL) 500 MG capsule Take 500 mg by mouth daily as needed. 11/23/17  Yes [provider]  atorvastatin (LIPITOR) 10 MG tablet TAKE 1 TABLET BY MOUTH DAILY 12/12/17  Yes Markie Heffernan, ElGelene MinkPA-C  Blood Glucose Monitoring Suppl (TRUE METRIX METER) W/DEVICE KIT Use as directed 02/08/15  Yes Micheline Chapman, NP  glucose blood (TRUE METRIX BLOOD GLUCOSE TEST) test strip Use as instructed 02/08/15  Yes Micheline Chapman, NP  hydrochlorothiazide (HYDRODIURIL) 50 MG tablet Take 1 tablet (50 mg total) by mouth daily. 12/02/17  Yes Tedrick Port, Gelene Mink, PA-C  Insulin Glargine (LANTUS SOLOSTAR) 100 UNIT/ML Solostar Pen Start with 10 units and increase by 2 units a day every 2 days if BS  over 150. Patient taking differently: Inject 65 Units into the skin daily at 10 pm.  08/08/17  Yes Skarlett Sedlacek, Gelene Mink, PA-C  Insulin Pen Needle 32G X 6 MM MISC Inject 10 units daily at bed time. Advance 2 units every 2 days until a fasting blood sugar of 130. 08/08/17  Yes Yannis Broce, Gelene Mink, PA-C  metFORMIN (GLUCOPHAGE) 1000 MG tablet Take 1 tablet (1,000 mg total) by mouth 2 (two) times daily with a meal. 12/02/17  Yes Dajanique Robley, Gelene Mink, PA-C  TRUEPLUS LANCETS 26G MISC Use as directed! 02/08/15  Yes Micheline Chapman, NP  meclizine (ANTIVERT) 25 MG tablet Take 1 tablet (25 mg total) by mouth 3 (three) times daily as needed for dizziness. Patient not taking: Reported on 4/54/0981 1/91/47   Delora Fuel, MD    No Known Allergies  Past Surgical History:  Procedure Laterality Date  . ABDOMINAL HYSTERECTOMY    . APPENDECTOMY    . COLONOSCOPY WITH PROPOFOL N/A 10/07/2017   Procedure: COLONOSCOPY WITH PROPOFOL;  Surgeon: Doran Stabler, MD;  Location: [facility];  Service: Gastroenterology;  Laterality: N/A;    Social History   Tobacco Use  . Smoking status: Never Smoker  . Smokeless tobacco: Never Used  Substance Use Topics  . Alcohol use: No  . Drug use: No    Family History  Problem Relation Age of Onset  . Heart disease Mother   . Hypertension Mother   . Stroke Father   . Colon cancer Neg Hx   . Esophageal cancer Neg Hx   . Pancreatic cancer Neg Hx   . Rectal cancer Neg Hx   . Stomach cancer Neg Hx     Medication list has been reviewed and updated.  Physical Examination:  Physical Exam  Constitutional: She is oriented to person, place, and time. She appears well-developed and well-nourished. No distress.  HENT:  Head: Normocephalic and atraumatic.  Mouth/Throat: Oropharynx is clear and moist.  Eyes: Pupils are equal, round, and reactive to light. Conjunctivae and EOM are normal.  Cardiovascular: Normal rate, regular rhythm and normal heart  sounds.  No murmur heard. Pulmonary/Chest: Effort normal and breath sounds normal. She has no wheezes.  Genitourinary: Vagina normal and uterus normal. Cervix exhibits no motion tenderness, no discharge and no friability. Right adnexum displays no mass and no tenderness. Left adnexum displays no mass and no tenderness.  Musculoskeletal: Normal range of motion.  Neurological: She is alert and oriented to person, place, and time. She has normal reflexes.  Skin: Skin is warm and dry.     Psychiatric: She has a normal mood and affect. Her behavior is normal. Judgment and thought content normal.  Vitals reviewed.   BP (!) 140/96   Pulse 89   Temp 98.4 F (36.9 C) (Oral)   Resp 16   Ht 5' 2.5" (1.588 m)   Wt 296 lb 3.2 oz (134.4 kg)   SpO2 95%   BMI 53.31 kg/m   Results for orders placed or performed in visit on 12/24/17  POCT glycosylated hemoglobin (Hb A1C)  Result Value Ref Range   Hemoglobin A1C 9.9     Assessment and Plan: 1. Annual physical exam - pt presents for annual exam. She is doing very well today. Started going to the gym several days a week and is working on her diet. PAP done today. Colonoscopy done 09/2017- next screening in 10 years. MM done 09/2017 - next MM in one year. Endorses medication compliance. Home sugars are 160-180. A1C improved from 11.8 to 9.9. Discussed goal of A1C < 8. RTC in 6 months for recheck DM. 2. Screening for endocrine, nutritional, metabolic and immunity disorder - CMP14+EGFR - VITAMIN D 25 Hydroxy (Vit-D Deficiency, Fractures) - POCT glycosylated hemoglobin (Hb A1C) - CBC - POCT urinalysis dipstick  3. Screening, lipid - Lipid panel  4. Screening for thyroid disorder - TSH  5. Screening for cervical cancer 6. Encounter for gynecological examination without abnormal finding - Pap IG, CT/NG NAA, and HPV (high risk)  7. Lipoma of right lower extremity - She will consider removal. OK to refer to gen surgery  Mercer Pod, PA-C    [facility] 12/24/2017 9:12 AM

## 2017-12-25 LAB — CBC
Hematocrit: 39.1 % (ref 34.0–46.6)
Hemoglobin: 13 g/dL (ref 11.1–15.9)
MCH: 30.3 pg (ref 26.6–33.0)
MCHC: 33.2 g/dL (ref 31.5–35.7)
MCV: 91 fL (ref 79–97)
Platelets: 439 10*3/uL — ABNORMAL HIGH (ref 150–379)
RBC: 4.29 x10E6/uL (ref 3.77–5.28)
RDW: 13.4 % (ref 12.3–15.4)
WBC: 7 10*3/uL (ref 3.4–10.8)

## 2017-12-25 LAB — CMP14+EGFR
ALT: 18 IU/L (ref 0–32)
AST: 19 IU/L (ref 0–40)
Albumin/Globulin Ratio: 1.2 (ref 1.2–2.2)
Albumin: 4.2 g/dL (ref 3.5–5.5)
Alkaline Phosphatase: 75 IU/L (ref 39–117)
BUN/Creatinine Ratio: 20 (ref 9–23)
BUN: 21 mg/dL (ref 6–24)
Bilirubin Total: 0.5 mg/dL (ref 0.0–1.2)
CO2: 24 mmol/L (ref 20–29)
Calcium: 9.9 mg/dL (ref 8.7–10.2)
Chloride: 99 mmol/L (ref 96–106)
Creatinine, Ser: 1.03 mg/dL — ABNORMAL HIGH (ref 0.57–1.00)
GFR calc Af Amer: 72 mL/min/{1.73_m2} (ref >59)
GFR calc non Af Amer: 63 mL/min/{1.73_m2} (ref >59)
Globulin, Total: 3.6 g/dL (ref 1.5–4.5)
Glucose: 189 mg/dL — ABNORMAL HIGH (ref 65–99)
Potassium: 4.6 mmol/L (ref 3.5–5.2)
Sodium: 139 mmol/L (ref 134–144)
Total Protein: 7.8 g/dL (ref 6.0–8.5)

## 2017-12-25 LAB — LIPID PANEL
Chol/HDL Ratio: 3.8 ratio (ref 0.0–4.4)
Cholesterol, Total: 202 mg/dL — ABNORMAL HIGH (ref 100–199)
HDL: 53 mg/dL (ref >39)
LDL Calculated: 124 mg/dL — ABNORMAL HIGH (ref 0–99)
Triglycerides: 127 mg/dL (ref 0–149)
VLDL Cholesterol Cal: 25 mg/dL (ref 5–40)

## 2017-12-25 LAB — TSH: TSH: 5.22 u[IU]/mL — ABNORMAL HIGH (ref 0.450–4.500)

## 2017-12-25 LAB — VITAMIN D 25 HYDROXY (VIT D DEFICIENCY, FRACTURES): Vit D, 25-Hydroxy: 5.1 ng/mL — ABNORMAL LOW (ref 30.0–100.0)

## 2017-12-26 ENCOUNTER — Encounter: Payer: Self-pay | Admitting: Physician Assistant

## 2017-12-26 ENCOUNTER — Other Ambulatory Visit: Payer: Self-pay | Admitting: Physician Assistant

## 2017-12-26 DIAGNOSIS — E559 Vitamin D deficiency, unspecified: Secondary | ICD-10-CM

## 2017-12-26 DIAGNOSIS — E039 Hypothyroidism, unspecified: Secondary | ICD-10-CM

## 2017-12-26 MED ORDER — VITAMIN D (ERGOCALCIFEROL) 1.25 MG (50000 UNIT) PO CAPS: 50000.0000 [IU] | ORAL_CAPSULE | ORAL | 0 refills | Status: DC

## 2017-12-26 MED ORDER — VITAMIN D3 10 MCG (400 UNIT) PO TABS: 800.0000 [IU] | ORAL_TABLET | Freq: Every day | ORAL | 1 refills | Status: AC

## 2017-12-26 NOTE — Progress Notes (Signed)
Please call pt and let her know: 1) her vitamin D level is extremely low. She needs to start a Vitamin D supplement - I sent this in to her pharmacy with instructions. Come back and see me for a recheck in 3 months. I am sending a letter in the mail with results and further instructions.   2) her thyroid level is elevated. Please come back when she gets the chance for LAB ONLY visit to check other thyroid function tests.  3) Her cholesterol levels look good and are improving. Keep up the great work going to the gym and eating healthy!  4) her kidney and liver function are fantastic.  5) PAP results are still pending. - will follow.

## 2017-12-27 LAB — PAP IG, CT-NG NAA, HPV HIGH-RISK
Chlamydia, Nuc. Acid Amp: NEGATIVE
Gonococcus by Nucleic Acid Amp: NEGATIVE
HPV, high-risk: NEGATIVE
PAP Smear Comment: 0

## 2017-12-27 NOTE — Progress Notes (Signed)
Please call pt and let her know her PAP is negative - next PAP due in 3 years. Thank you!

## 2017-12-29 ENCOUNTER — Encounter: Payer: Self-pay | Admitting: *Deleted

## 2017-12-29 NOTE — Telephone Encounter (Signed)
basaglar kwikpen/levemir flextouch/tresiba flextouch   Are all approved medications do you want to change the lantus solostar pen to one of these?

## 2017-12-30 ENCOUNTER — Other Ambulatory Visit: Payer: Self-pay | Admitting: Physician Assistant

## 2017-12-30 DIAGNOSIS — E119 Type 2 diabetes mellitus without complications: Secondary | ICD-10-CM

## 2017-12-30 DIAGNOSIS — Z794 Long term (current) use of insulin: Principal | ICD-10-CM

## 2017-12-30 NOTE — Telephone Encounter (Signed)
Sorry... If her lantus is denied, OK to fill basaglar kwikpen.

## 2017-12-30 NOTE — Telephone Encounter (Signed)
I am unclear. Was this pt's Metformin or lantus denied? She should be on Metformin 1,000mg  bid and lantus. Can you please call her pharmacy and ask? Thank you!

## 2018-01-28 ENCOUNTER — Encounter: Payer: Self-pay | Admitting: Neurology

## 2018-01-28 ENCOUNTER — Ambulatory Visit (INDEPENDENT_AMBULATORY_CARE_PROVIDER_SITE_OTHER): Payer: 59 | Admitting: Neurology

## 2018-01-28 ENCOUNTER — Other Ambulatory Visit: Payer: Self-pay | Admitting: Physician Assistant

## 2018-01-28 VITALS — BP 191/132 | HR 87 | Ht 62.5 in | Wt 307.0 lb

## 2018-01-28 DIAGNOSIS — Z6841 Body Mass Index (BMI) 40.0 and over, adult: Secondary | ICD-10-CM | POA: Diagnosis not present

## 2018-01-28 DIAGNOSIS — R0681 Apnea, not elsewhere classified: Secondary | ICD-10-CM | POA: Diagnosis not present

## 2018-01-28 DIAGNOSIS — G479 Sleep disorder, unspecified: Secondary | ICD-10-CM | POA: Diagnosis not present

## 2018-01-28 DIAGNOSIS — I10 Essential (primary) hypertension: Secondary | ICD-10-CM | POA: Diagnosis not present

## 2018-01-28 DIAGNOSIS — R351 Nocturia: Secondary | ICD-10-CM

## 2018-01-28 DIAGNOSIS — R0683 Snoring: Secondary | ICD-10-CM | POA: Diagnosis not present

## 2018-01-28 NOTE — Progress Notes (Signed)
Subjective:    Patient ID: Virginia [name] is a 52 y.o. female.  HPI     Star Age, MD, PhD [facility] [address] [place], [place] [address]  Dear Benjamine Mola,   I saw your patient, Virginia [name], upon your kind request, in my neurologic clinic today for initial consultation of her sleep disorder, in particular, concern for underlying obstructive sleep apnea. The patient is unaccompanied today. As you know, Virginia [name] is a 52 year old right-handed woman with an underlying medical history of hypertension, type 2 diabetes, hyperlipidemia, vertigo and morbid obesity with a BMI of over 50, who reports snoring and excessive daytime somnolence. I reviewed your office note from 12/02/2017. Her Epworth sleepiness score is 7 out of 24, fatigue score is 20 out of 63. She is divorced and lives with her daughter. She works for [employer]. She is a nonsmoker and drinks alcohol rarely, caffeine in the form of soda, 2 16 ounce bottles per day on average. She reports that she has been off her blood pressure medication for the past few days. She has not picked up her refill yet. She is trying to lose weight and has joined a gym. She has had some right shoulder pain which impaired her ability to exercise. She has been told that she has pauses in her breathing by her boyfriend. Her 43 year old daughter lives with her. She is not aware of any family history of OSA. She has a TV in the bedroom, which tends to stay on at night. They have no pets. Bedtime is variable, somewhere between 10 PM and 1 AM typically. Rise time is around 6 or 645. She has woken up with palpitations and has had rare morning headaches. She has nocturia about 2-3 times per average night. She denies any symptoms of restless leg syndrome.  Her Past Medical History Is Significant For: Past Medical History:  Diagnosis Date  . Anemia   . Diabetes mellitus without complication ([place])   .  Hyperlipemia   . Hypertension   . Thyroid disease     Her Past Surgical History Is Significant For: Past Surgical History:  Procedure Laterality Date  . ABDOMINAL HYSTERECTOMY    . APPENDECTOMY    . COLONOSCOPY WITH PROPOFOL N/A 10/07/2017   Procedure: COLONOSCOPY WITH PROPOFOL;  Surgeon: Doran Stabler, MD;  Location: [facility];  Service: Gastroenterology;  Laterality: N/A;    Her Family History Is Significant For: Family History  Problem Relation Age of Onset  . Heart disease Mother   . Hypertension Mother   . Stroke Father   . Colon cancer Neg Hx   . Esophageal cancer Neg Hx   . Pancreatic cancer Neg Hx   . Rectal cancer Neg Hx   . Stomach cancer Neg Hx     Her Social History Is Significant For: Social History   Socioeconomic History  . Marital status: Divorced    Spouse [name]: Not on file  . Number of children: 1  . Years of education: Not on file  . Highest education level: Not on file  Occupational History  . Not on file  Social Needs  . Financial resource strain: Not on file  . Food insecurity:    Worry: Not on file    Inability: Not on file  . Transportation needs:    Medical: Not on file    Non-medical: Not on file  Tobacco Use  . Smoking status: Never Smoker  .  Smokeless tobacco: Never Used  Substance and Sexual Activity  . Alcohol use: No  . Drug use: No  . Sexual activity: Never  Lifestyle  . Physical activity:    Days per week: Not on file    Minutes per session: Not on file  . Stress: Not on file  Relationships  . Social connections:    Talks on phone: Not on file    Gets together: Not on file    Attends religious service: Not on file    Active member of club or organization: Not on file    Attends meetings of clubs or organizations: Not on file    Relationship status: Not on file  Other Topics Concern  . Not on file  Social History Narrative  . Not on file    Her Allergies Are:  No Known Allergies:   Her Current  Medications Are:  Outpatient Encounter Medications as of 01/28/2018  Medication Sig  . amLODipine (NORVASC) 10 MG tablet TAKE 1 TABLET BY MOUTH DAILY  . amoxicillin (AMOXIL) 500 MG capsule Take 500 mg by mouth daily as needed.  Marland Kitchen atorvastatin (LIPITOR) 10 MG tablet TAKE 1 TABLET BY MOUTH DAILY  . Blood Glucose Monitoring Suppl (TRUE METRIX METER) W/DEVICE KIT Use as directed  . [START ON 02/08/2018] Cholecalciferol (VITAMIN D3) 400 units tablet Take 2 tablets (800 Units total) by mouth daily.  Marland Kitchen glucose blood (TRUE METRIX BLOOD GLUCOSE TEST) test strip Use as instructed  . hydrochlorothiazide (HYDRODIURIL) 50 MG tablet Take 1 tablet (50 mg total) by mouth daily.  . Insulin Glargine (LANTUS SOLOSTAR) 100 UNIT/ML Solostar Pen Inject 65 Units into the skin daily at 10 pm.  . Insulin Pen Needle 32G X 6 MM MISC Inject 10 units daily at bed time. Advance 2 units every 2 days until a fasting blood sugar of 130.  . metFORMIN (GLUCOPHAGE) 1000 MG tablet Take 1 tablet (1,000 mg total) by mouth 2 (two) times daily with a meal.  . TRUEPLUS LANCETS 26G MISC Use as directed!  . Vitamin D, Ergocalciferol, (DRISDOL) 50000 units CAPS capsule Take 1 capsule (50,000 Units total) by mouth every 7 (seven) days.   No facility-administered encounter medications on file as of 01/28/2018.   :  Review of Systems:  Out of a complete 14 point review of systems, all are reviewed and negative with the exception of these symptoms as listed below: Review of Systems  Neurological:       Pt presents today to discuss her sleep. Pt has never had a sleep study but does endorse snoring.  Epworth Sleepiness Scale 0= would never doze 1= slight chance of dozing 2= moderate chance of dozing 3= high chance of dozing  Sitting and reading: 2 Watching TV: 2 Sitting inactive in a public place (ex. Theater or meeting): 0 As a passenger in a car for an hour without a break: 0 Lying down to rest in the afternoon: 3 Sitting and  talking to someone: 0 Sitting quietly after lunch (no alcohol): 0 In a car, while stopped in traffic: 0 Total: 7     Objective:  Neurological Exam  Physical Exam Physical Examination:   Vitals:   01/28/18 1550  BP: (!) 214/131  Pulse: 98   Upon recheck: 191/132 Later: 178/122  General Examination: The patient is a very pleasant 52 y.o. female in no acute distress. She appears well-developed and well-nourished and well groomed. She denies any chest pain, blurry vision, headache, shortness of breath.  HEENT:  Normocephalic, atraumatic, pupils are equal, round and reactive to light and accommodation. Extraocular tracking is good without limitation to gaze excursion or nystagmus noted. Normal smooth pursuit is noted. Hearing is grossly intact. Face is symmetric with normal facial animation and normal facial sensation. Speech is clear with no dysarthria noted. There is no hypophonia. There is no lip, neck/head, jaw or voice tremor. Neck is supple with full range of passive and active motion. There are no carotid bruits on auscultation. Oropharynx exam reveals: moderate mouth dryness, adequate dental hygiene and marked airway crowding, due to will airway entry, longer uvula, redundant soft palate, longer tongue and tonsils are 1-2+ in size bilaterally. Neck circumference is 16-1/8 inches. She has a mild overbite.  Chest: Clear to auscultation without wheezing, rhonchi or crackles noted.  Heart: S1+S2+0, regular and normal without murmurs, rubs or gallops noted.   Abdomen: Soft, non-tender and non-distended with normal bowel sounds appreciated on auscultation.  Extremities: There is 1-2+ pitting edema in the distal lower extremities bilaterally, Mostly prominent around both ankles, left more than right.  Skin: Warm and dry without trophic changes noted.  Musculoskeletal: exam reveals no obvious joint deformities, tenderness or joint swelling or erythema.   Neurologically:  Mental  status: The patient is awake, alert and oriented in all 4 spheres. Her immediate and remote memory, attention, language skills and fund of knowledge are appropriate. There is no evidence of aphasia, agnosia, apraxia or anomia. Speech is clear with normal prosody and enunciation. Thought process is linear. Mood is normal and affect is normal.  Cranial nerves II - XII are as described above under HEENT exam. In addition: shoulder shrug is normal with equal shoulder height noted. Motor exam: Normal bulk, strength and tone is noted. There is no drift, tremor or rebound. Romberg is negative. Fine motor skills and coordination: grossly intact.  Cerebellar testing: No dysmetria or intention tremor on finger to nose testing. Heel to shin is unremarkable bilaterally. There is no truncal or gait ataxia.  Sensory exam: intact to light touch in the upper and lower extremities.  Gait, station and balance: She stands easily. No veering to one side is noted. No leaning to one side is noted. Posture is age-appropriate and stance is narrow based. Gait shows normal stride length and normal pace. No problems turning are noted. Tandem walk is unremarkable.                Assessment and Plan:   In summary, Virginia [name] is a very pleasant 52 y.o.-year old female with an underlying medical history of hypertension, type 2 diabetes, hyperlipidemia, vertigo and morbid obesity with a BMI of over 50, whose history and physical exam are concerning for obstructive sleep apnea (OSA). I had a long chat with the patient about my findings and the diagnosis of OSA, its prognosis and treatment options. We talked about medical treatments, surgical interventions and non-pharmacological approaches. I explained in particular the risks and ramifications of untreated moderate to severe OSA, especially with respect to developing cardiovascular disease down the Road, including congestive heart failure, difficult to treat hypertension, cardiac  arrhythmias, or stroke. Even type 2 diabetes has, in part, been linked to untreated OSA. Symptoms of untreated OSA include daytime sleepiness, memory problems, mood irritability and mood disorder such as depression and anxiety, lack of energy, as well as recurrent headaches, especially morning headaches. We talked about trying to maintain a healthy lifestyle in general, as well as the importance of weight control. I encouraged  the patient to eat healthy, exercise daily and keep well hydrated, to keep a scheduled bedtime and wake time routine, to not skip any meals and eat healthy snacks in between meals. I advised the patient not to drive when feeling sleepy.  I recommended the following at this time: sleep study with potential positive airway pressure titration. (We will score hypopneas at 4%). She is strongly advised to get in touch with your office regarding her blood pressure management and refill on her blood pressure medications. She does not endorse any symptoms currently of accelerated hypertension or hypertensive urgency.    I explained the sleep test procedure to the patient and also outlined possible surgical and non-surgical treatment options of OSA, including the use of a custom-made dental device (which would require a referral to a specialist dentist or oral surgeon), upper airway surgical options, such as pillar implants, radiofrequency surgery, tongue base surgery, and UPPP (which would involve a referral to an ENT surgeon). Rarely, jaw surgery such as mandibular advancement may be considered.  I also explained the CPAP treatment option to the patient, who indicated that she would be willing to try CPAP if the need arises. I explained the importance of being compliant with PAP treatment, not only for insurance purposes but primarily to improve Her symptoms, and for the patient's long term health benefit, including to reduce Her cardiovascular risks. I answered all her questions today and the  patient was in agreement. I would like to see her back after the sleep study is completed and encouraged her to call with any interim questions, concerns, problems or updates.   Thank you very much for allowing me to participate in the care of this nice patient. If I can be of any further assistance to you please do not hesitate to call me at [phone].  Sincerely,   Star Age, MD, PhD

## 2018-01-28 NOTE — Patient Instructions (Addendum)
Thank you for choosing [facility] for your sleep related care! It was nice to meet you today! I appreciate that you entrust me with your sleep related healthcare concerns. I hope, I was able to address at least some of your concerns today, and that I can help you feel reassured and also get better.    Here is what we discussed today and what we came up with as our plan for you:    Based on your symptoms and your exam I believe you are at risk for obstructive sleep apnea (aka OSA), and I think we should proceed with a sleep study to determine whether you do or do not have OSA and how severe it is. Even, if you have mild OSA, I may want you to consider treatment with CPAP, as treatment of even borderline or mild sleep apnea can result and improvement of symptoms such as sleep disruption, daytime sleepiness, nighttime bathroom breaks, restless leg symptoms, improvement of headache syndromes, even improved mood disorder.   Please remember, the long-term risks and ramifications of untreated moderate to severe obstructive sleep apnea are: increased Cardiovascular disease, including congestive heart failure, stroke, difficult to control hypertension, treatment resistant obesity, arrhythmias, especially irregular heartbeat commonly known as A. Fib. (atrial fibrillation); even type 2 diabetes has been linked to untreated OSA.   Sleep apnea can cause disruption of sleep and sleep deprivation in most cases, which, in turn, can cause recurrent headaches, problems with memory, mood, concentration, focus, and vigilance. Most people with untreated sleep apnea report excessive daytime sleepiness, which can affect their ability to drive. Please do not drive if you feel sleepy. Patients with sleep apnea developed difficulty initiating and maintaining sleep (aka insomnia).   Having sleep apnea may increase your risk for other sleep disorders, including involuntary behaviors sleep such as sleep terrors,  sleep talking, sleepwalking.    Having sleep apnea can also increase your risk for restless leg syndrome and leg movements at night.   Please note that untreated obstructive sleep apnea may carry additional perioperative morbidity. Patients with significant obstructive sleep apnea (typically, in the moderate to severe degree) should receive, if possible, perioperative PAP (positive airway pressure) therapy and the surgeons and particularly the anesthesiologists should be informed of the diagnosis and the severity of the sleep disordered breathing.   I will likely see you back after your sleep study to go over the test results and where to go from there. We will call you after your sleep study to advise about the results (most likely, you will hear from [place], my nurse) and to set up an appointment at the time, as necessary.    Our sleep lab administrative assistant will call you to schedule your sleep study and give you further instructions, regarding chicken process with a sleep study, arrival time, what to bring, when you can expect to leave after the study, and to answer any other logistical questions you may have. If you don't hear back from her by about 2 weeks from now, please feel free to call her direct line at [phone] or you can call our general clinic number, or email [place] through My Chart.   Please call your primary care provider immediately for a refill on your blood pressure medications as your blood pressure was repeatedly very high today.

## 2018-01-30 ENCOUNTER — Telehealth: Payer: Self-pay

## 2018-01-30 DIAGNOSIS — R0683 Snoring: Secondary | ICD-10-CM

## 2018-01-30 NOTE — Telephone Encounter (Signed)
[employer] denied in lad sleep study, need HST order

## 2018-01-30 NOTE — Telephone Encounter (Signed)
VO for HST from Dr. Athar received. HST order placed.  

## 2018-02-04 DIAGNOSIS — E11319 Type 2 diabetes mellitus with unspecified diabetic retinopathy without macular edema: Secondary | ICD-10-CM | POA: Diagnosis not present

## 2018-02-04 DIAGNOSIS — H04123 Dry eye syndrome of bilateral lacrimal glands: Secondary | ICD-10-CM | POA: Diagnosis not present

## 2018-02-18 ENCOUNTER — Ambulatory Visit (INDEPENDENT_AMBULATORY_CARE_PROVIDER_SITE_OTHER): Payer: 59 | Admitting: Neurology

## 2018-02-18 DIAGNOSIS — G4733 Obstructive sleep apnea (adult) (pediatric): Secondary | ICD-10-CM

## 2018-02-18 DIAGNOSIS — G4734 Idiopathic sleep related nonobstructive alveolar hypoventilation: Secondary | ICD-10-CM

## 2018-02-18 DIAGNOSIS — R0683 Snoring: Secondary | ICD-10-CM

## 2018-02-20 NOTE — Procedures (Signed)
[facility] [address], [place] [address] NAME:   Virginia [name]                                                               DOB: 12/09/1965 MEDICAL RECORD [id number]                                                     DOS:  02/18/18 REFERRING PHYSICIAN: Gelene Mink McVey, PA STUDY PERFORMED: Home Sleep Test HISTORY: 52 year old woman with a history of hypertension, type 2 diabetes, hyperlipidemia, vertigo and morbid obesity with a BMI of over 50, who reports snoring and excessive daytime somnolence. Her Epworth sleepiness score is 7 out of 24, BMI:56.1  STUDY RESULTS:  Total Recording Time: 10 hours, 48 minutes Total Apnea/Hypopnea Index (AHI):  27.1/h, RDI: 28.8/h Average Oxygen Saturation:    94%, Lowest Oxygen Desaturation: 67 %  Total Time Oxygen Saturation Below or at 88%: 21 minutes  Average Heart Rate:   90 bpm (between 76 and 127 bpm) IMPRESSION: OSA, nocturnal hypoxemia RECOMMENDATION: This home sleep test demonstrates moderate obstructive sleep apnea with a total AHI of 27.1/hour and O2 nadir of 67% with significant time below 89% saturation, indicating nocturnal hypoxemia. Given the patient's medical history and sleep related complaints, treatment with positive airway pressure (in the form of CPAP) is recommended. This will require a full night CPAP titration study for proper treatment settings, O2 monitoring and mask fitting. Based on the severity of the sleep disordered breathing an attended titration study is indicated. However, patient's insurance has denied an attended sleep study; therefore, the patient will be advised to proceed with an autoPAP titration/trial at home for now. Please note that untreated obstructive sleep apnea may carry additional perioperative morbidity. Patients with significant obstructive sleep apnea should receive perioperative PAP therapy and the surgeons and particularly the anesthesiologist should be  informed of the diagnosis and the severity of the sleep disordered breathing. The patient should be cautioned not to drive, work at heights, or operate dangerous or heavy equipment when tired or sleepy. Review and reiteration of good sleep hygiene measures should be pursued with any patient. Other causes of the patient's symptoms, including circadian rhythm disturbances, an underlying mood disorder, medication effect and/or an underlying medical problem cannot be ruled out based on this test. Clinical correlation is recommended. The patient and his referring provider will be notified of the test results. The patient will be seen in follow up in sleep clinic at [facility]. I certify that I have reviewed the raw data recording prior to the issuance of this report in accordance with the standards of the American Academy of Sleep Medicine (AASM).  Star Age, MD, PhD [facility]) Diplomat, ABPN (Neurology and Sleep)

## 2018-02-20 NOTE — Progress Notes (Signed)
Patient referred by Ms. McVey, PA, seen by me on 01/28/18, HST on 02/18/18.    Please call and notify the patient that the recent home sleep test showed obstructive sleep apnea. OSA is the moderate range and ideally would warrant CPAP titration study for proper CPAP therapy, but her insurance has denied an attended sleep study, therefore, I recommend treatment for this in the form of autoPAP, which means, that we don't have to bring her in for a sleep study with CPAP, but will let her try an autoPAP machine at home, through a DME company (of her choice, or as per insurance requirement). The DME representative will educate her on how to use the machine, how to put the mask on, etc. I have placed an order in the chart. Please send referral, talk to patient, send report to referring MD. We will need a FU in sleep clinic for 10 weeks post-PAP set up, please arrange that with me or one of our NPs. Thanks,   Virginia Age, MD, PhD [facility])

## 2018-02-20 NOTE — Addendum Note (Signed)
Addended by: Star Age on: 02/20/2018 12:33 PM   Modules accepted: Orders

## 2018-02-23 ENCOUNTER — Telehealth: Payer: Self-pay

## 2018-02-23 NOTE — Telephone Encounter (Signed)
-----   Message from Star Age, MD sent at 02/20/2018 12:33 PM EDT ----- Patient referred by Ms. McVey, PA, seen by me on 01/28/18, HST on 02/18/18.    Please call and notify the patient that the recent home sleep test showed obstructive sleep apnea. OSA is the moderate range and ideally would warrant CPAP titration study for proper CPAP therapy, but her insurance has denied an attended sleep study, therefore, I recommend treatment for this in the form of autoPAP, which means, that we don't have to bring her in for a sleep study with CPAP, but will let her try an autoPAP machine at home, through a DME company (of her choice, or as per insurance requirement). The DME representative will educate her on how to use the machine, how to put the mask on, etc. I have placed an order in the chart. Please send referral, talk to patient, send report to referring MD. We will need a FU in sleep clinic for 10 weeks post-PAP set up, please arrange that with me or one of our NPs. Thanks,   Star Age, MD, PhD [facility])

## 2018-02-23 NOTE — Telephone Encounter (Signed)
I called pt. I advised pt that Dr. Rexene Alberts reviewed their sleep study results and found that pt has osa in the moderate range. Dr. Rexene Alberts recommends that pt start an autopap, since pt's insurance will likely deny an in-lab cpap titration study. I reviewed PAP compliance expectations with the pt. Pt is agreeable to starting an auto-PAP. I advised pt that an order will be sent to a DME, [facility], and [facility] will call the pt within about one week after they file with the pt's insurance. [facility] will show the pt how to use the machine, fit for masks, and troubleshoot the auto-PAP if needed. A follow up appt was made for insurance purposes with Dr. Rexene Alberts on 06/03/18 at 8:30am. Pt verbalized understanding to arrive 15 minutes early and bring their auto-PAP. A letter with all of this information in it will be sent to the pt's mychart account as a reminder. I verified with the pt that the address we have on file is correct. Pt verbalized understanding of results. Pt had no questions at this time but was encouraged to call back if questions arise.

## 2018-03-04 ENCOUNTER — Telehealth: Payer: Self-pay | Admitting: Physician Assistant

## 2018-03-04 DIAGNOSIS — G4733 Obstructive sleep apnea (adult) (pediatric): Secondary | ICD-10-CM | POA: Diagnosis not present

## 2018-03-04 NOTE — Telephone Encounter (Signed)
Copied from [place] [id number]. Topic: Quick Communication - Rx Refill/Question >> Mar 04, 2018  6:22 PM Oliver Pila B wrote: Medication: insulin  Pt called and is needing a refill for her insulin, but her insurance does not pay for her old brand, they gave her 2 new brands that they will cover which are listed below; the Tier 1 is the most economic for the pt but the pt wants to know which will be better for her, pt is asking pcp which insulin will be the better choice, call pt to advise  Basaglar quick pen(Tier 1) Unk Lightning (Tier 2)

## 2018-03-06 ENCOUNTER — Other Ambulatory Visit: Payer: Self-pay | Admitting: Physician Assistant

## 2018-03-06 DIAGNOSIS — I10 Essential (primary) hypertension: Secondary | ICD-10-CM

## 2018-03-06 DIAGNOSIS — E118 Type 2 diabetes mellitus with unspecified complications: Secondary | ICD-10-CM

## 2018-03-06 DIAGNOSIS — Z794 Long term (current) use of insulin: Principal | ICD-10-CM

## 2018-03-06 MED ORDER — AMLODIPINE BESYLATE 10 MG PO TABS: 10.0000 mg | ORAL_TABLET | Freq: Every day | ORAL | 4 refills | Status: DC

## 2018-03-06 MED ORDER — BASAGLAR KWIKPEN 100 UNIT/ML ~~LOC~~ SOPN: 65.0000 [IU] | PEN_INJECTOR | Freq: Every day | SUBCUTANEOUS | 5 refills | Status: DC

## 2018-03-06 NOTE — Telephone Encounter (Signed)
Pt. Calling again about insulin.   She is almost out and needs by Sunday 6/23.   Please call and let pt know

## 2018-03-06 NOTE — Telephone Encounter (Signed)
Message to [place] re: insulin change

## 2018-03-06 NOTE — Telephone Encounter (Signed)
Basaglar Rx sent to pharmacy

## 2018-04-03 DIAGNOSIS — G4733 Obstructive sleep apnea (adult) (pediatric): Secondary | ICD-10-CM | POA: Diagnosis not present

## 2018-05-04 DIAGNOSIS — G4733 Obstructive sleep apnea (adult) (pediatric): Secondary | ICD-10-CM | POA: Diagnosis not present

## 2018-05-08 ENCOUNTER — Ambulatory Visit: Payer: 59 | Admitting: Physician Assistant

## 2018-05-22 ENCOUNTER — Ambulatory Visit: Payer: 59 | Admitting: Physician Assistant

## 2018-06-03 ENCOUNTER — Ambulatory Visit: Payer: Self-pay | Admitting: Neurology

## 2018-06-03 ENCOUNTER — Telehealth: Payer: Self-pay

## 2018-06-03 NOTE — Telephone Encounter (Signed)
Pt did not show for their appt with Dr. Athar today.  

## 2018-06-04 ENCOUNTER — Encounter: Payer: Self-pay | Admitting: Neurology

## 2018-06-04 DIAGNOSIS — G4733 Obstructive sleep apnea (adult) (pediatric): Secondary | ICD-10-CM | POA: Diagnosis not present

## 2018-06-26 ENCOUNTER — Ambulatory Visit: Payer: 59 | Admitting: Physician Assistant

## 2018-07-04 DIAGNOSIS — G4733 Obstructive sleep apnea (adult) (pediatric): Secondary | ICD-10-CM | POA: Diagnosis not present

## 2018-08-04 DIAGNOSIS — G4733 Obstructive sleep apnea (adult) (pediatric): Secondary | ICD-10-CM | POA: Diagnosis not present

## 2018-09-03 DIAGNOSIS — G4733 Obstructive sleep apnea (adult) (pediatric): Secondary | ICD-10-CM | POA: Diagnosis not present

## 2018-10-04 DIAGNOSIS — G4733 Obstructive sleep apnea (adult) (pediatric): Secondary | ICD-10-CM | POA: Diagnosis not present

## 2018-11-04 DIAGNOSIS — G4733 Obstructive sleep apnea (adult) (pediatric): Secondary | ICD-10-CM | POA: Diagnosis not present

## 2018-12-03 DIAGNOSIS — G4733 Obstructive sleep apnea (adult) (pediatric): Secondary | ICD-10-CM | POA: Diagnosis not present

## 2018-12-28 ENCOUNTER — Other Ambulatory Visit: Payer: Self-pay | Admitting: Physician Assistant

## 2018-12-28 DIAGNOSIS — E119 Type 2 diabetes mellitus without complications: Secondary | ICD-10-CM

## 2018-12-28 DIAGNOSIS — Z794 Long term (current) use of insulin: Principal | ICD-10-CM

## 2018-12-28 DIAGNOSIS — I10 Essential (primary) hypertension: Secondary | ICD-10-CM

## 2018-12-29 ENCOUNTER — Telehealth: Payer: Self-pay | Admitting: Family Medicine

## 2018-12-29 NOTE — Telephone Encounter (Signed)
Pt is out of Metphormin, She has TOC appt on 01/05/2019.can she refill until then. FR

## 2018-12-30 ENCOUNTER — Other Ambulatory Visit: Payer: Self-pay

## 2018-12-30 DIAGNOSIS — E119 Type 2 diabetes mellitus without complications: Secondary | ICD-10-CM

## 2018-12-30 DIAGNOSIS — Z794 Long term (current) use of insulin: Principal | ICD-10-CM

## 2018-12-30 MED ORDER — METFORMIN HCL 1000 MG PO TABS: 1000.0000 mg | ORAL_TABLET | Freq: Two times a day (BID) | ORAL | 0 refills | Status: DC

## 2018-12-30 NOTE — Telephone Encounter (Signed)
Sent in 30 day supply. 

## 2019-01-05 ENCOUNTER — Other Ambulatory Visit: Payer: Self-pay

## 2019-01-05 ENCOUNTER — Telehealth (INDEPENDENT_AMBULATORY_CARE_PROVIDER_SITE_OTHER): Payer: 59 | Admitting: Family Medicine

## 2019-01-05 DIAGNOSIS — I10 Essential (primary) hypertension: Secondary | ICD-10-CM | POA: Diagnosis not present

## 2019-01-05 DIAGNOSIS — E119 Type 2 diabetes mellitus without complications: Secondary | ICD-10-CM

## 2019-01-05 DIAGNOSIS — E039 Hypothyroidism, unspecified: Secondary | ICD-10-CM

## 2019-01-05 DIAGNOSIS — E559 Vitamin D deficiency, unspecified: Secondary | ICD-10-CM

## 2019-01-05 DIAGNOSIS — Z794 Long term (current) use of insulin: Secondary | ICD-10-CM

## 2019-01-05 NOTE — Progress Notes (Signed)
VIDEO Doximity Encounter- SOAP NOTE Established Patient  This telephone encounter was conducted with the patient's (or proxy's) verbal consent via audio telecommunications: yes/no: Yes Patient was instructed to have this encounter in a suitably private space; and to only have persons present to whom they give permission to participate. In addition, patient identity was confirmed by use of name plus two identifiers (DOB and address).  I discussed the limitations, risks, security and privacy concerns of performing an evaluation and management service by telephone and the availability of in person appointments. I also discussed with the patient that there may be a patient responsible charge related to this service. The patient expressed understanding and agreed to proceed.  I spent a total of TIME; 0 MIN TO 60 MIN: 25 minutes talking with the patient or their proxy.  No chief complaint on file.   Subjective   Virginia [name] is a 53 y.o. established patient. 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  She states that her office building is testing her temperatures as  they enter the building and asking questions So she has to get daily screenings If the screening was positive she would be sent home  Hypertension: Patient here for follow-up of elevated blood pressure. She is not exercising and is not adherent to low salt diet.  Blood pressure is not checked at home. Cardiac symptoms fatigue. Patient denies chest pain, chest pressure/discomfort and exertional chest pressure/discomfort.  Cardiovascular risk factors: diabetes mellitus, dyslipidemia, hypertension, obesity (BMI >= 30 kg/m2) and sedentary lifestyle. Use of agents associated with hypertension: none. History of target organ damage: none. BP Readings from Last 3 Encounters:  01/28/18 (!) 191/132  12/24/17 (!) 140/96  12/02/17 (!) 147/97   She has not checked her bp at all  She states that she is taking amlodipine and was out of the HCTZ  She states that s  Uncontrolled Diabetes- Noncompliant She is on Insulin glargine 65 units in the past  She states that when she moved her insulin got left  She states that she needs her insulin refilled She does not check her sugars She states that she has not been able to get back to her medications She reports that she is pretty much out of her medications. She has metformin and amlodipine Lab Results  Component Value Date   HGBA1C 9.9 12/24/2017    Vitamin D deficiency She denies currently taking any vitamin D She reports that she feels tired   Patient Active Problem List   Diagnosis Date Noted  . Carpal tunnel syndrome  of right wrist 11/18/2017  . Diabetes ([place]) 02/08/2015  . Essential hypertension 02/08/2015    Past Medical History:  Diagnosis Date  . Anemia   . Diabetes mellitus without complication ([place])   . Hyperlipemia   . Hypertension   . Thyroid disease     Current Outpatient Medications  Medication Sig Dispense Refill  . amLODipine (NORVASC) 10 MG tablet Take 1 tablet (10 mg total) by mouth daily. 30 tablet 4  . Blood Glucose  Monitoring Suppl (TRUE METRIX METER) W/DEVICE KIT Use as directed 1 kit 11  . hydrochlorothiazide (HYDRODIURIL) 50 MG tablet Take 1 tablet (50 mg total) by mouth daily. 30 tablet 11  . Insulin Glargine (BASAGLAR KWIKPEN) 100 UNIT/ML SOPN Inject 0.65 mLs (65 Units total) into the skin at bedtime. 5 pen 5  . Insulin Pen Needle 32G X 6 MM MISC Inject 10 units daily at bed time. Advance 2 units every 2 days until a fasting blood sugar of 130. 100 each 11  . metFORMIN (GLUCOPHAGE) 1000 MG tablet Take 1 tablet (1,000 mg total) by mouth 2 (two) times daily with a meal. 60 tablet 0  . TRUEPLUS LANCETS 26G MISC Use as directed! 100 each 11  . amoxicillin (AMOXIL) 500 MG capsule Take 500 mg by mouth daily as needed.  10  . atorvastatin (LIPITOR) 10 MG tablet TAKE 1 TABLET BY MOUTH DAILY (Patient not taking: Reported on 01/05/2019) 30 tablet 0  . Cholecalciferol (VITAMIN D3) 400 units tablet Take 2 tablets (800 Units total) by mouth daily. (Patient not taking: Reported on 01/05/2019) 90 tablet 1  . glucose blood (TRUE METRIX BLOOD GLUCOSE TEST) test strip Use as instructed (Patient not taking: Reported on 01/05/2019) 100 each 12  . Vitamin D, Ergocalciferol, (DRISDOL) 50000 units CAPS capsule Take 1 capsule (50,000 Units total) by mouth every 7 (seven) days. (Patient not taking: Reported on 01/05/2019) 6 capsule 0   No current facility-administered medications for this visit.     No Known Allergies  Social History   Socioeconomic History  . Marital status: Divorced    Spouse name: Not on file  . Number of children: 1  . Years of education: Not on file  . Highest education level: Not on file  Occupational History  . Not on file  Social Needs  . Financial resource strain: Not on file  . Food insecurity:    Worry: Not on file    Inability: Not on file  . Transportation needs:    Medical: Not on file    Non-medical: Not on file  Tobacco Use  . Smoking status: Never Smoker  . Smokeless tobacco:  Never Used  Substance and Sexual Activity  . Alcohol use: No  . Drug use: No  . Sexual activity: Never  Lifestyle  . Physical activity:    Days per week: Not on file    Minutes per session: Not on file  . Stress: Not on file  Relationships  . Social connections:    Talks on phone: Not on file    Gets together: Not on file    Attends religious service: Not on file    Active member of club or organization: Not on file    Attends meetings of clubs or organizations: Not on file    Relationship status: Not on file  . Intimate partner violence:    Fear of current or ex partner: Not on file    Emotionally abused: Not on file    Physically abused:  Not on file    Forced sexual activity: Not on file  Other Topics Concern  . Not on file  Social History Narrative  . Not on file    ROS Review of Systems  Constitutional: fatigue, no fevers or chills HENT: Negative for congestion, nosebleeds, trouble swallowing and voice change.   Respiratory: Negative for cough, shortness of breath and wheezing.   Gastrointestinal: Negative for diarrhea, nausea and vomiting.  Genitourinary: Negative for difficulty urinating, dysuria, flank pain and hematuria.  Musculoskeletal: Negative for back pain, joint swelling and neck pain.  Neurological: Negative for dizziness, speech difficulty, light-headedness and numbness.  See HPI. All other review of systems negative.   Objective   Vitals as reported by the patient: There were no vitals filed for this visit.  Diagnoses and all orders for this visit:  Essential hypertension- bp uncontrolled Pt to get bp monitor and come in for office visit for bp check  -     CMP14+EGFR; Future -     Lipid panel; Future  Type 2 diabetes mellitus without complication, with long-term current use of insulin ([place])- discussed her lack of meds leading to derangments of her diabetes -     CMP14+EGFR; Future  Vitamin D deficiency- discussed SUPPLEMENTATION Discussed  impact of deficiency -     Vitamin D, 25-hydroxy; Future  Hypothyroidism, unspecified type- note from last visit was for pat to get retest -     TSH; Future     I discussed the assessment and treatment plan with the patient. The patient was provided an opportunity to ask questions and all were answered. The patient agreed with the plan and demonstrated an understanding of the instructions.   The patient was advised to call back or seek an in-person evaluation if the symptoms worsen or if the condition fails to improve as anticipated.  I provided 25 minutes of non-face-to-face time during this encounter.  Forrest Moron, MD  Primary Care at [facility]

## 2019-01-05 NOTE — Patient Instructions (Signed)
° ° ° °  If you have lab work done today you will be contacted with your lab results within the next 2 weeks.  If you have not heard from us then please contact us. The fastest way to get your results is to register for My Chart. ° ° °IF you received an x-ray today, you will receive an invoice from [facility]. Please contact [facility] at [phone] with questions or concerns regarding your invoice.  ° °IF you received labwork today, you will receive an invoice from [facility]. Please contact [facility] at [phone] with questions or concerns regarding your invoice.  ° °Our billing staff will not be able to assist you with questions regarding bills from these companies. ° °You will be contacted with the lab results as soon as they are available. The fastest way to get your results is to activate your My Chart account. Instructions are located on the last page of this paperwork. If you have not heard from us regarding the results in 2 weeks, please contact this office. °  ° ° ° °

## 2019-01-05 NOTE — Progress Notes (Signed)
CC: Transfer of Care from [place].  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 Pt concerned due to help issues and if it is safe to go back to work.  Advised dr Nolon Rod will assess the situation with her and come up with a plan and give her a note if she feels she needs to quarantine.  No travel outside the [place] or [place] in the past 3 weeks.

## 2019-01-06 ENCOUNTER — Ambulatory Visit (INDEPENDENT_AMBULATORY_CARE_PROVIDER_SITE_OTHER): Payer: 59 | Admitting: Family Medicine

## 2019-01-06 ENCOUNTER — Other Ambulatory Visit: Payer: Self-pay

## 2019-01-06 DIAGNOSIS — E039 Hypothyroidism, unspecified: Secondary | ICD-10-CM | POA: Diagnosis not present

## 2019-01-06 DIAGNOSIS — I10 Essential (primary) hypertension: Secondary | ICD-10-CM | POA: Diagnosis not present

## 2019-01-06 DIAGNOSIS — E559 Vitamin D deficiency, unspecified: Secondary | ICD-10-CM

## 2019-01-06 DIAGNOSIS — Z794 Long term (current) use of insulin: Secondary | ICD-10-CM | POA: Diagnosis not present

## 2019-01-06 DIAGNOSIS — E119 Type 2 diabetes mellitus without complications: Secondary | ICD-10-CM

## 2019-01-06 NOTE — Progress Notes (Signed)
Nurse visit for labs only

## 2019-01-07 LAB — CMP14+EGFR
ALT: 16 IU/L (ref 0–32)
AST: 14 IU/L (ref 0–40)
Albumin/Globulin Ratio: 1.3 (ref 1.2–2.2)
Albumin: 4.2 g/dL (ref 3.8–4.9)
Alkaline Phosphatase: 89 IU/L (ref 39–117)
BUN/Creatinine Ratio: 16 (ref 9–23)
BUN: 12 mg/dL (ref 6–24)
Bilirubin Total: 0.5 mg/dL (ref 0.0–1.2)
CO2: 22 mmol/L (ref 20–29)
Calcium: 9.8 mg/dL (ref 8.7–10.2)
Chloride: 102 mmol/L (ref 96–106)
Creatinine, Ser: 0.77 mg/dL (ref 0.57–1.00)
GFR calc Af Amer: 102 mL/min/{1.73_m2} (ref >59)
GFR calc non Af Amer: 88 mL/min/{1.73_m2} (ref >59)
Globulin, Total: 3.2 g/dL (ref 1.5–4.5)
Glucose: 303 mg/dL — ABNORMAL HIGH (ref 65–99)
Potassium: 4.1 mmol/L (ref 3.5–5.2)
Sodium: 140 mmol/L (ref 134–144)
Total Protein: 7.4 g/dL (ref 6.0–8.5)

## 2019-01-07 LAB — LIPID PANEL
Chol/HDL Ratio: 4.9 ratio — ABNORMAL HIGH (ref 0.0–4.4)
Cholesterol, Total: 238 mg/dL — ABNORMAL HIGH (ref 100–199)
HDL: 49 mg/dL (ref >39)
LDL Calculated: 150 mg/dL — ABNORMAL HIGH (ref 0–99)
Triglycerides: 195 mg/dL — ABNORMAL HIGH (ref 0–149)
VLDL Cholesterol Cal: 39 mg/dL (ref 5–40)

## 2019-01-07 LAB — TSH: TSH: 5.89 u[IU]/mL — ABNORMAL HIGH (ref 0.450–4.500)

## 2019-01-07 LAB — VITAMIN D 25 HYDROXY (VIT D DEFICIENCY, FRACTURES): Vit D, 25-Hydroxy: 6.5 ng/mL — ABNORMAL LOW (ref 30.0–100.0)

## 2019-01-07 LAB — HEMOGLOBIN A1C
Est. average glucose Bld gHb Est-mCnc: 289 mg/dL
Hgb A1c MFr Bld: 11.7 % — ABNORMAL HIGH (ref 4.8–5.6)

## 2019-01-11 ENCOUNTER — Telehealth: Payer: Self-pay | Admitting: Family Medicine

## 2019-01-11 NOTE — Telephone Encounter (Signed)
Pt dropped off FMLA paperwork. Paid $15 fee. Due by may 12th, 2020. Placed in the providers box at nurses station

## 2019-01-12 NOTE — Telephone Encounter (Signed)
Paperwork taken from provider box and given to provider for completion. Dgaddy, CMA

## 2019-01-14 ENCOUNTER — Telehealth (INDEPENDENT_AMBULATORY_CARE_PROVIDER_SITE_OTHER): Payer: 59 | Admitting: Family Medicine

## 2019-01-14 ENCOUNTER — Encounter: Payer: Self-pay | Admitting: Family Medicine

## 2019-01-14 ENCOUNTER — Other Ambulatory Visit: Payer: Self-pay

## 2019-01-14 DIAGNOSIS — I10 Essential (primary) hypertension: Secondary | ICD-10-CM | POA: Diagnosis not present

## 2019-01-14 DIAGNOSIS — E039 Hypothyroidism, unspecified: Secondary | ICD-10-CM

## 2019-01-14 DIAGNOSIS — E782 Mixed hyperlipidemia: Secondary | ICD-10-CM | POA: Diagnosis not present

## 2019-01-14 DIAGNOSIS — E559 Vitamin D deficiency, unspecified: Secondary | ICD-10-CM | POA: Diagnosis not present

## 2019-01-14 DIAGNOSIS — E1165 Type 2 diabetes mellitus with hyperglycemia: Secondary | ICD-10-CM

## 2019-01-14 MED ORDER — ALCOHOL WIPES 70 % PADS: MEDICATED_PAD | 3 refills | Status: AC

## 2019-01-14 MED ORDER — VITAMIN D (ERGOCALCIFEROL) 1.25 MG (50000 UNIT) PO CAPS: 50000.0000 [IU] | ORAL_CAPSULE | ORAL | 1 refills | Status: AC

## 2019-01-14 MED ORDER — AMLODIPINE BESYLATE 10 MG PO TABS: 10.0000 mg | ORAL_TABLET | Freq: Every day | ORAL | 0 refills | Status: AC

## 2019-01-14 MED ORDER — LEVOTHYROXINE SODIUM 50 MCG PO TABS: 50.0000 ug | ORAL_TABLET | Freq: Every day | ORAL | 0 refills | Status: DC

## 2019-01-14 MED ORDER — ATORVASTATIN CALCIUM 20 MG PO TABS: 20.0000 mg | ORAL_TABLET | Freq: Every day | ORAL | 3 refills | Status: AC

## 2019-01-14 MED ORDER — LOSARTAN POTASSIUM-HCTZ 100-25 MG PO TABS: 1.0000 | ORAL_TABLET | Freq: Every day | ORAL | 0 refills | Status: DC

## 2019-01-14 MED ORDER — DULAGLUTIDE 1.5 MG/0.5ML ~~LOC~~ SOAJ: 1.5000 mg | SUBCUTANEOUS | 3 refills | Status: DC

## 2019-01-14 MED ORDER — BLOOD GLUCOSE METER KIT: PACK | 0 refills | Status: AC

## 2019-01-14 NOTE — Progress Notes (Signed)
CC: follow up lab results. Pt says she dropped her FMLA paperwork and would like to know the status and when she will be able to return to work.  Pt needs refill on Basaglar and hctz.  No travel outside the [place] or [place] in the past 3 weeks.

## 2019-01-14 NOTE — Patient Instructions (Signed)
° ° ° °  If you have lab work done today you will be contacted with your lab results within the next 2 weeks.  If you have not heard from us then please contact us. The fastest way to get your results is to register for My Chart. ° ° °IF you received an x-ray today, you will receive an invoice from [facility]. Please contact [facility] at [phone] with questions or concerns regarding your invoice.  ° °IF you received labwork today, you will receive an invoice from [facility]. Please contact [facility] at [phone] with questions or concerns regarding your invoice.  ° °Our billing staff will not be able to assist you with questions regarding bills from these companies. ° °You will be contacted with the lab results as soon as they are available. The fastest way to get your results is to activate your My Chart account. Instructions are located on the last page of this paperwork. If you have not heard from us regarding the results in 2 weeks, please contact this office. °  ° ° ° °

## 2019-01-14 NOTE — Progress Notes (Signed)
DOXIMITY FACETIME Encounter- SOAP NOTE Established Patient  This telephone encounter was conducted with the patient's (or proxy's) verbal consent via audio AND VIDEO telecommunications: yes/no: Yes Patient was instructed to have this encounter in a suitably private space; and to only have persons present to whom they give permission to participate. In addition, patient identity was confirmed by use of name plus two identifiers (DOB and address).  I discussed the limitations, risks, security and privacy concerns of performing an evaluation and management service by telephone and the availability of in person appointments. I also discussed with the patient that there may be a patient responsible charge related to this service. The patient expressed understanding and agreed to proceed.  I spent a total of TIME; 0 MIN TO 60 MIN: 45 minutes talking with the patient or their proxy.  CC: diabetes follow up, FMLA  Subjective   Virginia [name] is a 53 y.o. established patient. Telephone visit today for  HPI  Diabetes Mellitus: Patient presents for follow up of diabetes. Symptoms: hyperglycemia and polyuria. Symptoms have progressed to a point and plateaued. Patient denies hypoglycemia , paresthesia of the feet, visual disturbances and vomitting.  Evaluation to date has been included: hemoglobin A1C.  Home sugars: patient does not check sugars. Lab Results  Component Value Date   HGBA1C 11.7 (H) 01/06/2019   She was out of her meds and recently resumed metformin   Hypothyroidism: Patient presents for evaluation of thyroid function. Symptoms consist of fatigue, feeling cold and cold intolerance. Symptoms have present for several months. The symptoms are moderate.  The problem has been unchanged.  Previous thyroid studies include TSH. The hypothyroidism is due to hypothyroidism.  Component     Latest Ref Rng & Units 02/08/2015 12/24/2017 01/06/2019  TSH     0.450 - 4.500 uIU/mL 4.303 5.220 (H)  5.890 (H)    Dyslipidemia: Patient presents for evaluation of lipids.  Compliance with treatment thus far has been poor.  A repeat fasting lipid profile was done.  The patient does not use medications that may worsen dyslipidemias (corticosteroids, progestins, anabolic steroids, diuretics, beta-blockers, amiodarone, cyclosporine, olanzapine). The patient exercises rarely.  The patient is not known to have coexisting coronary artery disease.   The 10-year ASCVD risk score Mikey Bussing DC Brooke Bonito., et al., 2013) is: 47%   Values used to calculate the score:     Age: 48 years     Sex: Female     Is Non-Hispanic African American: Yes     Diabetic: Yes     Tobacco smoker: No     Systolic Blood Pressure: 161 mmHg     Is BP treated: Yes     HDL Cholesterol: 49 mg/dL     Total Cholesterol: 238 mg/dL  Lab Results  Component Value Date   CHOL 238 (H) 01/06/2019   CHOL 202 (H) 12/24/2017   CHOL 225 (H) 08/08/2017   Lab Results  Component Value Date   HDL 49 01/06/2019   HDL 53 12/24/2017   HDL 51 08/08/2017   Lab Results  Component Value Date   LDLCALC 150 (H) 01/06/2019   LDLCALC 124 (H) 12/24/2017   LDLCALC 134 (H) 08/08/2017   Lab Results  Component Value Date   TRIG 195 (H) 01/06/2019   TRIG 127 12/24/2017   TRIG 199 (H) 08/08/2017   Lab Results  Component Value Date   CHOLHDL 4.9 (H) 01/06/2019   CHOLHDL 3.8 12/24/2017   CHOLHDL 4.4 08/08/2017   No  results found for: LDLDIRECT   Hypertension: Patient here for follow-up of elevated blood pressure. She is not exercising and is adherent to low salt diet.  Blood pressure is not well controlled at home. Cardiac symptoms fatigue. Patient denies chest pressure/discomfort and exertional chest pressure/discomfort.  Cardiovascular risk factors: diabetes mellitus, dyslipidemia, hypertension, obesity (BMI >= 30 kg/m2) and sedentary lifestyle. Use of agents associated with hypertension: none. History of target organ damage: none. BP Readings  from Last 3 Encounters:  01/28/18 (!) 191/132  12/24/17 (!) 140/96  12/02/17 (!) 147/97   Lab Results  Component Value Date   CREATININE 0.77 01/06/2019   Vitamin D def She reports fatigue She denies bone pain She states that she could dose off at any time She is not taking any vitamin D supplementation   Patient Active Problem List   Diagnosis Date Noted  . Carpal tunnel syndrome of right wrist 11/18/2017  . Diabetes ([place]) 02/08/2015  . Essential hypertension 02/08/2015    Past Medical History:  Diagnosis Date  . Anemia   . Diabetes mellitus without complication ([place])   . Hyperlipemia   . Hypertension   . Thyroid disease     Current Outpatient Medications  Medication Sig Dispense Refill  . amLODipine (NORVASC) 10 MG tablet Take 1 tablet (10 mg total) by mouth daily. 90 tablet 0  . Blood Glucose Monitoring Suppl (TRUE METRIX METER) W/DEVICE KIT Use as directed 1 kit 11  . glucose blood (TRUE METRIX BLOOD GLUCOSE TEST) test strip Use as instructed 100 each 12  . hydrochlorothiazide (HYDRODIURIL) 50 MG tablet Take 1 tablet (50 mg total) by mouth daily. 30 tablet 11  . Insulin Glargine (BASAGLAR KWIKPEN) 100 UNIT/ML SOPN Inject 0.65 mLs (65 Units total) into the skin at bedtime. 5 pen 5  . Insulin Pen Needle 32G X 6 MM MISC Inject 10 units daily at bed time. Advance 2 units every 2 days until a fasting blood sugar of 130. 100 each 11  . metFORMIN (GLUCOPHAGE) 1000 MG tablet Take 1 tablet (1,000 mg total) by mouth 2 (two) times daily with a meal. 60 tablet 0  . TRUEPLUS LANCETS 26G MISC Use as directed! 100 each 11  . Alcohol Swabs (ALCOHOL WIPES) 70 % PADS Use to clean finger for checking capillary blood glucose 100 each 3  . atorvastatin (LIPITOR) 20 MG tablet Take 1 tablet (20 mg total) by mouth daily at 6 PM. 90 tablet 3  . blood glucose meter kit and supplies Dispense based on pt/insurance pref. Use up to 4 times daily as directed. (FOR ICD-10 E10.9, E11.9). 1 each 0  .  Cholecalciferol (VITAMIN D3) 400 units tablet Take 2 tablets (800 Units total) by mouth daily. (Patient not taking: Reported on 01/14/2019) 90 tablet 1  . Dulaglutide (TRULICITY) 1.5 EA/5.4UJ SOPN Inject 1.5 mg into the skin once a week. 4 pen 3  . levothyroxine (SYNTHROID) 50 MCG tablet Take 1 tablet (50 mcg total) by mouth daily. 90 tablet 0  . losartan-hydrochlorothiazide (HYZAAR) 100-25 MG tablet Take 1 tablet by mouth daily. 90 tablet 0  . Vitamin D, Ergocalciferol, (DRISDOL) 1.25 MG (50000 UT) CAPS capsule Take 1 capsule (50,000 Units total) by mouth every 7 (seven) days. 12 capsule 1   No current facility-administered medications for this visit.     No Known Allergies  Social History   Socioeconomic History  . Marital status: Divorced    Spouse name: Not on file  . Number of children: 1  .  Years of education: Not on file  . Highest education level: Not on file  Occupational History  . Not on file  Social Needs  . Financial resource strain: Not on file  . Food insecurity:    Worry: Not on file    Inability: Not on file  . Transportation needs:    Medical: Not on file    Non-medical: Not on file  Tobacco Use  . Smoking status: Never Smoker  . Smokeless tobacco: Never Used  Substance and Sexual Activity  . Alcohol use: No  . Drug use: No  . Sexual activity: Never  Lifestyle  . Physical activity:    Days per week: Not on file    Minutes per session: Not on file  . Stress: Not on file  Relationships  . Social connections:    Talks on phone: Not on file    Gets together: Not on file    Attends religious service: Not on file    Active member of club or organization: Not on file    Attends meetings of clubs or organizations: Not on file    Relationship status: Not on file  . Intimate partner violence:    Fear of current or ex partner: Not on file    Emotionally abused: Not on file    Physically abused: Not on file    Forced sexual activity: Not on file  Other  Topics Concern  . Not on file  Social History Narrative  . Not on file    ROS Review of Systems  Constitutional: Negative for activity change, appetite change, chills and fever. +FATIGUE HENT: Negative for congestion, nosebleeds, trouble swallowing and voice change.   Respiratory: Negative for cough, shortness of breath and wheezing.   Gastrointestinal: Negative for diarrhea, nausea and vomiting.  Genitourinary: Negative for difficulty urinating, dysuria, flank pain and hematuria. +POLYURIA Musculoskeletal: Negative for back pain, joint swelling and neck pain.  Neurological: Negative for dizziness, speech difficulty, light-headedness and numbness.  See HPI. All other review of systems negative.   Objective   Vitals as reported by the patient:   Alert and oriented  Patient ambulating in a store without difficulty EOM intact bilaterally Head atraumatic Nose normal Normal pulmonary effort Skin without jaundice  There were no vitals filed for this visit.  Diagnoses and all orders for this visit:  Acquired hypothyroidism  Malignant hypertension -     amLODipine (NORVASC) 10 MG tablet; Take 1 tablet (10 mg total) by mouth daily.  Vitamin D deficiency -     Vitamin D, Ergocalciferol, (DRISDOL) 1.25 MG (50000 UT) CAPS capsule; Take 1 capsule (50,000 Units total) by mouth every 7 (seven) days.  Mixed hyperlipidemia -     atorvastatin (LIPITOR) 20 MG tablet; Take 1 tablet (20 mg total) by mouth daily at 6 PM.  Uncontrolled type 2 diabetes mellitus with hyperglycemia ([facility])  Other orders -     Dulaglutide (TRULICITY) 1.5 GY/5.6LS SOPN; Inject 1.5 mg into the skin once a week. -     levothyroxine (SYNTHROID) 50 MCG tablet; Take 1 tablet (50 mcg total) by mouth daily. -     losartan-hydrochlorothiazide (HYZAAR) 100-25 MG tablet; Take 1 tablet by mouth daily. -     blood glucose meter kit and supplies; Dispense based on pt/insurance pref. Use up to 4 times daily as directed. (FOR  ICD-10 E10.9, E11.9). -     Alcohol Swabs (ALCOHOL WIPES) 70 % PADS; Use to clean finger for checking capillary blood glucose  Plan and PATIENT INSTRUCTIONS High Blood Pressure Take amlodipine 72m and Losartan-hctz 100-219mIf you get dizzy then stop the amlodipine and continue losartan-hctz  For diabetes Inject Trulicity once a week into the fatty part of the abdomen Continue metformin Exercise 20 minutes a day Cut back on starches like rice, pasta, bread, donuts, dinner rolls, biscuits and tacos There is learning material at Diabetes.org please sign up for their newsletter   For cholesterol  Take atorvastatin 2055mn the evening or before bed  For thyroid Take levothyroxine first thing in the morning 30-60 minutes before you eat anything or take any of your other meds Wash it down with plenty of water. Do not break or crush the pill.  For vitamin D deficiency Take ergocalciferol 50,000 units once a week  For heart disease prevention Take aspirin 47m76mer the counter   For next visit  Please come in for labs 2 days before your office visit. I have already ordered your labs. I can tell you the results of the test at the time of the visit. Also please get an eye exam. I have placed a referral with the Ophthalmologist for you. You need a pneumonia vaccine to protect you from pneumonia due to your severely uncontrolled diabetes. Stop by the office for a vaccine at any time or at your next visit with me.     I discussed the assessment and treatment plan with the patient. The patient was provided an opportunity to ask questions and all were answered. The patient agreed with the plan and demonstrated an understanding of the instructions.   The patient was advised to call back or seek an in-person evaluation if the symptoms worsen or if the condition fails to improve as anticipated.  I provided 45 minutes of face-to-face time during this encounter. ALSO completed her FMLA and  return to work form while on [employer]  [facility]

## 2019-02-12 ENCOUNTER — Other Ambulatory Visit: Payer: Self-pay

## 2019-02-12 ENCOUNTER — Encounter: Payer: 59 | Admitting: Family Medicine

## 2019-02-15 ENCOUNTER — Telehealth: Payer: Self-pay | Admitting: Family Medicine

## 2019-02-15 ENCOUNTER — Ambulatory Visit: Payer: 59 | Admitting: Family Medicine

## 2019-02-15 ENCOUNTER — Other Ambulatory Visit: Payer: 59

## 2019-02-15 ENCOUNTER — Other Ambulatory Visit: Payer: Self-pay

## 2019-02-15 ENCOUNTER — Telehealth: Payer: Self-pay | Admitting: *Deleted

## 2019-02-15 ENCOUNTER — Encounter: Payer: Self-pay | Admitting: Family Medicine

## 2019-02-15 VITALS — BP 138/70 | HR 98 | Temp 98.1°F | Resp 18 | Ht 62.5 in

## 2019-02-15 DIAGNOSIS — R432 Parageusia: Secondary | ICD-10-CM

## 2019-02-15 DIAGNOSIS — Z23 Encounter for immunization: Secondary | ICD-10-CM | POA: Diagnosis not present

## 2019-02-15 DIAGNOSIS — Z114 Encounter for screening for human immunodeficiency virus [HIV]: Secondary | ICD-10-CM

## 2019-02-15 DIAGNOSIS — R430 Anosmia: Secondary | ICD-10-CM

## 2019-02-15 DIAGNOSIS — Z20822 Contact with and (suspected) exposure to covid-19: Secondary | ICD-10-CM

## 2019-02-15 DIAGNOSIS — Z794 Long term (current) use of insulin: Secondary | ICD-10-CM | POA: Diagnosis not present

## 2019-02-15 DIAGNOSIS — E118 Type 2 diabetes mellitus with unspecified complications: Secondary | ICD-10-CM

## 2019-02-15 DIAGNOSIS — E119 Type 2 diabetes mellitus without complications: Secondary | ICD-10-CM

## 2019-02-15 DIAGNOSIS — Z6841 Body Mass Index (BMI) 40.0 and over, adult: Secondary | ICD-10-CM | POA: Diagnosis not present

## 2019-02-15 DIAGNOSIS — E1165 Type 2 diabetes mellitus with hyperglycemia: Secondary | ICD-10-CM | POA: Diagnosis not present

## 2019-02-15 DIAGNOSIS — R6889 Other general symptoms and signs: Secondary | ICD-10-CM

## 2019-02-15 LAB — POCT GLYCOSYLATED HEMOGLOBIN (HGB A1C): Hemoglobin A1C: 9 % — AB (ref 4.0–5.6)

## 2019-02-15 MED ORDER — BASAGLAR KWIKPEN 100 UNIT/ML ~~LOC~~ SOPN: 30.0000 [IU] | PEN_INJECTOR | Freq: Every day | SUBCUTANEOUS | 5 refills | Status: AC

## 2019-02-15 NOTE — Telephone Encounter (Signed)
Patient has been having loss of taste and smell. Some people in the office were PUI She does not know their status due to privacy issues She should be tested as she has diabetes, hypertension and thyroid disease.  She had headache and fatigue 4 days ago.

## 2019-02-15 NOTE — Addendum Note (Signed)
Addended by: Linus Orn A on: 02/15/2019 12:00 PM   Modules accepted: Orders

## 2019-02-15 NOTE — Telephone Encounter (Signed)
See other telephone encounter for 02/15/19. Left message for pt to return call to schedule testing for COVID-19.

## 2019-02-15 NOTE — Progress Notes (Signed)
Established Patient Office Visit  Subjective:  Patient ID: Virginia [name], female    DOB: April 22, 1966  Age: 53 y.o. MRN: [id number]  CC:  Chief Complaint  Patient presents with  . Diabetes    follow up   . taste    having some taste bud issues noticed yesterday     HPI Virginia [name] presents for   Fatigue and lack of smell/taste concerning for COVID Patient reports that she has been having issues with her tastebuds She states that she has lost the taste and sense of smell She reports that she has not been around anyone other than coworkers She states that she had a coworker who may have tested positive at work She denies fevers, chills, cough or cold, no sinus congestion, no recent travel, no symptoms of allergic rhinitis  4 days ago she had fatigue and a headache after work   Diabetes Mellitus and Morbid Obesity: Patient presents for follow up of diabetes. Symptoms: none. Symptoms have stabilized. Patient denies hypoglycemia , increase appetite, paresthesia of the feet, polydipsia, polyuria and visual disturbances.  Evaluation to date has been included: hemoglobin A1C.  Home sugars: patient does not check sugars.  She was taking trulicity but stopped the [facility] because she thought she was not supposed to be taking that.  She states that she does the turlicty 2.8UX weekly with metformin She was not checking her sugars because she could not pass her temperature  Lab Results  Component Value Date   HGBA1C 9.0 (A) 02/15/2019   Body mass index is 55.26 kg/m. She is eating more fruits and vegetables    Hypertension: Patient here for follow-up of elevated blood pressure. She is not exercising and is adherent to low salt diet.  Blood pressure is well controlled at home. Cardiac symptoms none. Patient denies chest pain, claudication, exertional chest pressure/discomfort, irregular heart beat, near-syncope and palpitations.  Cardiovascular risk factors: diabetes mellitus,  dyslipidemia, hypertension, obesity (BMI >= 30 kg/m2) and sedentary lifestyle. Use of agents associated with hypertension: none. History of target organ damage: none.  BP Readings from Last 3 Encounters:  02/15/19 138/70  01/28/18 (!) 191/132  12/24/17 (!) 140/96    Past Medical History:  Diagnosis Date  . Anemia   . Diabetes mellitus without complication ([place])   . Hyperlipemia   . Hypertension   . Thyroid disease     Past Surgical History:  Procedure Laterality Date  . ABDOMINAL HYSTERECTOMY    . APPENDECTOMY    . COLONOSCOPY WITH PROPOFOL N/A 10/07/2017   Procedure: COLONOSCOPY WITH PROPOFOL;  Surgeon: Doran Stabler, MD;  Location: [facility];  Service: Gastroenterology;  Laterality: N/A;    Family History  Problem Relation Age of Onset  . Heart disease Mother   . Hypertension Mother   . Stroke Father   . Colon cancer Neg Hx   . Esophageal cancer Neg Hx   . Pancreatic cancer Neg Hx   . Rectal cancer Neg Hx   . Stomach cancer Neg Hx     Social History   Socioeconomic History  . Marital status: Divorced    Spouse name: Not on file  . Number of children: 1  . Years of education: Not on file  . Highest education level: Not on file  Occupational History  . Not on file  Social Needs  . Financial resource strain: Not on file  . Food insecurity:    Worry: Not on file    Inability:  Not on file  . Transportation needs:    Medical: Not on file    Non-medical: Not on file  Tobacco Use  . Smoking status: Never Smoker  . Smokeless tobacco: Never Used  Substance and Sexual Activity  . Alcohol use: No  . Drug use: No  . Sexual activity: Never  Lifestyle  . Physical activity:    Days per week: Not on file    Minutes per session: Not on file  . Stress: Not on file  Relationships  . Social connections:    Talks on phone: Not on file    Gets together: Not on file    Attends religious service: Not on file    Active member of club or organization: Not on file     Attends meetings of clubs or organizations: Not on file    Relationship status: Not on file  . Intimate partner violence:    Fear of current or ex partner: Not on file    Emotionally abused: Not on file    Physically abused: Not on file    Forced sexual activity: Not on file  Other Topics Concern  . Not on file  Social History Narrative  . Not on file    Outpatient Medications Prior to Visit  Medication Sig Dispense Refill  . Alcohol Swabs (ALCOHOL WIPES) 70 % PADS Use to clean finger for checking capillary blood glucose 100 each 3  . amLODipine (NORVASC) 10 MG tablet Take 1 tablet (10 mg total) by mouth daily. 90 tablet 0  . atorvastatin (LIPITOR) 20 MG tablet Take 1 tablet (20 mg total) by mouth daily at 6 PM. 90 tablet 3  . blood glucose meter kit and supplies Dispense based on pt/insurance pref. Use up to 4 times daily as directed. (FOR ICD-10 E10.9, E11.9). 1 each 0  . Blood Glucose Monitoring Suppl (TRUE METRIX METER) W/DEVICE KIT Use as directed 1 kit 11  . Cholecalciferol (VITAMIN D3) 400 units tablet Take 2 tablets (800 Units total) by mouth daily. 90 tablet 1  . Dulaglutide (TRULICITY) 1.5 OI/7.8MV SOPN Inject 1.5 mg into the skin once a week. 4 pen 3  . glucose blood (TRUE METRIX BLOOD GLUCOSE TEST) test strip Use as instructed 100 each 12  . hydrochlorothiazide (HYDRODIURIL) 50 MG tablet Take 1 tablet (50 mg total) by mouth daily. 30 tablet 11  . Insulin Pen Needle 32G X 6 MM MISC Inject 10 units daily at bed time. Advance 2 units every 2 days until a fasting blood sugar of 130. 100 each 11  . levothyroxine (SYNTHROID) 50 MCG tablet Take 1 tablet (50 mcg total) by mouth daily. 90 tablet 0  . losartan-hydrochlorothiazide (HYZAAR) 100-25 MG tablet Take 1 tablet by mouth daily. 90 tablet 0  . metFORMIN (GLUCOPHAGE) 1000 MG tablet Take 1 tablet (1,000 mg total) by mouth 2 (two) times daily with a meal. 60 tablet 0  . TRUEPLUS LANCETS 26G MISC Use as directed! 100 each 11  .  Vitamin D, Ergocalciferol, (DRISDOL) 1.25 MG (50000 UT) CAPS capsule Take 1 capsule (50,000 Units total) by mouth every 7 (seven) days. 12 capsule 1  . Insulin Glargine (BASAGLAR KWIKPEN) 100 UNIT/ML SOPN Inject 0.65 mLs (65 Units total) into the skin at bedtime. 5 pen 5   No facility-administered medications prior to visit.     No Known Allergies  ROS Review of Systems Review of Systems  Constitutional: Negative for activity change, appetite change, chills and fever.  HENT: Negative for  congestion, nosebleeds, trouble swallowing and voice change.   Respiratory: Negative for cough, shortness of breath and wheezing.   Gastrointestinal: Negative for diarrhea, nausea and vomiting.  Genitourinary: Negative for difficulty urinating, dysuria, flank pain and hematuria.  Musculoskeletal: Negative for back pain, joint swelling and neck pain.  Neurological: Negative for dizziness, speech difficulty, light-headedness and numbness.  See HPI. All other review of systems negative.     Objective:    Physical Exam  BP 138/70   Pulse 98   Temp 98.1 F (36.7 C) (Oral)   Resp 18   Ht 5' 2.5" (1.588 m)   BMI 55.26 kg/m  Wt Readings from Last 3 Encounters:  01/28/18 (!) 307 lb (139.3 kg)  12/24/17 296 lb 3.2 oz (134.4 kg)  12/02/17 296 lb (134.3 kg)   Physical Exam  Constitutional: Oriented to person, place, and time. Appears well-developed and well-nourished.  HENT:  Head: Normocephalic and atraumatic.  Eyes: Conjunctivae and EOM are normal.  Cardiovascular: Normal rate, regular rhythm, normal heart sounds and intact distal pulses.  No murmur heard. Pulmonary/Chest: Effort normal and breath sounds normal. No stridor. No respiratory distress. Has no wheezes.  Neurological: Is alert and oriented to person, place, and time.  Skin: Skin is warm. Capillary refill takes less than 2 seconds.  Psychiatric: Has a normal mood and affect. Behavior is normal. Judgment and thought content normal.     Health Maintenance Due  Topic Date Due  . HIV Screening  09/30/1980  . OPHTHALMOLOGY EXAM  08/06/2018    There are no preventive care reminders to display for this patient.  Lab Results  Component Value Date   TSH 5.890 (H) 01/06/2019   Lab Results  Component Value Date   WBC 7.0 12/24/2017   HGB 13.0 12/24/2017   HCT 39.1 12/24/2017   MCV 91 12/24/2017   PLT 439 (H) 12/24/2017   Lab Results  Component Value Date   NA 139 02/15/2019   K 3.9 02/15/2019   CO2 21 02/15/2019   GLUCOSE 153 (H) 02/15/2019   BUN 18 02/15/2019   CREATININE 1.08 (H) 02/15/2019   BILITOT 0.5 02/15/2019   ALKPHOS 66 02/15/2019   AST 24 02/15/2019   ALT 21 02/15/2019   PROT 7.3 02/15/2019   ALBUMIN 4.1 02/15/2019   CALCIUM 9.3 02/15/2019   ANIONGAP 11 11/25/2017   Lab Results  Component Value Date   CHOL 157 02/15/2019   Lab Results  Component Value Date   HDL 41 02/15/2019   Lab Results  Component Value Date   LDLCALC 84 02/15/2019   Lab Results  Component Value Date   TRIG 159 (H) 02/15/2019   Lab Results  Component Value Date   CHOLHDL 3.8 02/15/2019   Lab Results  Component Value Date   HGBA1C 9.0 (A) 02/15/2019      Assessment & Plan:   Problem List Items Addressed This Visit      Endocrine   Diabetes ([place]) - Primary   Relevant Medications   Insulin Glargine (BASAGLAR KWIKPEN) 100 UNIT/ML SOPN   Other Relevant Orders   Lipid panel (Completed)   POCT glycosylated hemoglobin (Hb A1C) (Completed)   CMP14+EGFR (Completed)   HIV Antibody (routine testing w rflx) (Completed)   Pneumococcal polysaccharide vaccine 23-valent greater than or equal to 2yo subcutaneous/IM (Completed)   HM Diabetes Foot Exam (Completed)    Other Visit Diagnoses    Encounter for screening for HIV    - verbal consent given   Relevant Orders  HIV Antibody (routine testing w rflx) (Completed)   Morbid obesity with BMI of 50.0-59.9, adult ([place])    - discussed diet,exercise and weight loss    Relevant Medications   Insulin Glargine (BASAGLAR KWIKPEN) 100 UNIT/ML SOPN   Other Relevant Orders   Lipid panel (Completed)   POCT glycosylated hemoglobin (Hb A1C) (Completed)   CMP14+EGFR (Completed)   Loss of smell       Loss of taste       Type 2 diabetes mellitus with complication, with long-term current use of insulin (H[facility])    - improved but still uncontrolled, will resume basaglar at half dose   Relevant Medications   Insulin Glargine (BASAGLAR KWIKPEN) 100 UNIT/ML SOPN   Suspected 2019 Novel Coronavirus Infection    - will send for community testing [facility] notified      Meds ordered this encounter  Medications  . Insulin Glargine (BASAGLAR KWIKPEN) 100 UNIT/ML SOPN    Sig: Inject 0.3 mLs (30 Units total) into the skin at bedtime.    Dispense:  5 pen    Refill:  5    Follow-up: No follow-ups on file.    Forrest Moron, MD

## 2019-02-15 NOTE — Telephone Encounter (Signed)
Dr. Nolon Rod request COVID 19 test.

## 2019-02-15 NOTE — Patient Instructions (Addendum)
If you have lab work done today you will be contacted with your lab results within the next 2 weeks.  If you have not heard from [place] then please contact us. The fastest way to get your results is to register for My Chart.   IF you received an x-ray today, you will receive an invoice from [facility]. Please contact [facility] at [phone] with questions or concerns regarding your invoice.   IF you received labwork today, you will receive an invoice from [place]. Please contact [facility] at [phone] with questions or concerns regarding your invoice.   Our billing staff will not be able to assist you with questions regarding bills from these companies.  You will be contacted with the lab results as soon as they are available. The fastest way to get your results is to activate your My Chart account. Instructions are located on the last page of this paperwork. If you have not heard from [place] regarding the results in 2 weeks, please contact this office.       Person Under Monitoring Name: Virginia [name]  Location: [place] [address]   CORONAVIRUS DISEASE 2019 (COVID-19) Guidance for Persons Under Investigation You are being tested for the virus that causes coronavirus disease 2019 (COVID-19). Public health actions are necessary to ensure protection of your health and the health of others, and to prevent further spread of infection. COVID-19 is caused by a virus that can cause symptoms, such as fever, cough, and shortness of breath. The primary transmission from person to person is by coughing or sneezing. On October 15, 2018, the [place] announced a [employer] Emergency of International Concern and on October 16, 2018 the U.S. Department of Health and Human Services declared a public health emergency. If the virus that causesCOVID-19 spreads in the community, it could have severe public health consequences.  As a person  under investigation for COVID-19, the [place] advises you to adhere to the following guidance until your test results are reported to you. If your test result is positive, you will receive additional information from your provider and your local health department at that time.   Remain at home until you are cleared by your health provider or public health authorities.   Keep a log of visitors to your home using the form provided. Any visitors to your home must be aware of your isolation status.  If you plan to move to a new address or leave the county, notify the local health department in your county.  Call a doctor or seek care if you have an urgent medical need. Before seeking medical care, call ahead and get instructions from the provider before arriving at the medical office, clinic or hospital. Notify them that you are being tested for the virus that causes COVID-19 so arrangements can be made, as necessary, to prevent transmission to others in the healthcare setting. Next, notify the local health department in your county.  If a medical emergency arises and you need to call 911, inform the first responders that you are being tested for the virus that causes COVID-19. Next, notify the local health department in your county.  Adhere to all guidance set forth by the [place] for [facility] of patients that is based on guidance from the Center for Disease Control and Prevention with suspected or confirmed COVID-19. It is  provided with this guidance for Persons Under Investigation.  Your health and the health of our community are our top priorities. Public Health officials remain available to provide assistance and counseling to you about COVID-19 and compliance with this guidance.  Provider: ____________________________________________________________ Date: ______/_____/_________  By signing  below, you acknowledge that you have read and agree to comply with this Guidance for Persons Under Investigation. ______________________________________________________________ Date: ______/_____/_________  WHO DO I CALL? You can find a list of local health departments here: [email or web address] Health Department: ____________________________________________________________________ Contact Name: ________________________________________________________________________ Telephone: ___________________________________________________________________________  Marice Potter, [place], Communicable Disease Branch COVID-19 Guidance for Persons Under Investigation November 21, 2018   Person Under Monitoring Name: Virginia [name]  Location: [place] [address]   Infection Prevention Recommendations for Individuals Confirmed to have, or Being Evaluated for, 2019 Novel Coronavirus (COVID-19) Infection Who Receive Care at Home  Individuals who are confirmed to have, or are being evaluated for, COVID-19 should follow the prevention steps below until a healthcare provider or local or state health department says they can return to normal activities.  Stay home except to get medical care You should restrict activities outside your home, except for getting medical care. Do not go to work, school, or public areas, and do not use public transportation or taxis.  Call ahead before visiting your doctor Before your medical appointment, call the healthcare provider and tell them that you have, or are being evaluated for, COVID-19 infection. This will help the healthcare provider's office take steps to keep other people from getting infected. Ask your healthcare provider to call the local or state health department.  Monitor your symptoms Seek prompt medical attention if your illness is worsening (e.g., difficulty  breathing). Before going to your medical appointment, call the healthcare provider and tell them that you have, or are being evaluated for, COVID-19 infection. Ask your healthcare provider to call the local or state health department.  Wear a facemask You should wear a facemask that covers your nose and mouth when you are in the same room with other people and when you visit a healthcare provider. People who live with or visit you should also wear a facemask while they are in the same room with you.  Separate yourself from other people in your home As much as possible, you should stay in a different room from other people in your home. Also, you should use a separate bathroom, if available.  Avoid sharing household items You should not share dishes, drinking glasses, cups, eating utensils, towels, bedding, or other items with other people in your home. After using these items, you should wash them thoroughly with soap and water.  Cover your coughs and sneezes Cover your mouth and nose with a tissue when you cough or sneeze, or you can cough or sneeze into your sleeve. Throw used tissues in a lined trash can, and immediately wash your hands with soap and water for at least 20 seconds or use an alcohol-based hand rub.  Wash your [facility] your hands often and thoroughly with soap and water for at least 20 seconds. You can use an alcohol-based hand sanitizer if soap and water are not available and if your hands are not visibly dirty. Avoid touching your eyes, nose, and mouth with unwashed hands.   Prevention Steps for Caregivers and Household Members of Individuals Confirmed to have, or Being Evaluated for, COVID-19 Infection Being Cared for in the Home  If you live  with, or provide care at home for, a person confirmed to have, or being evaluated for, COVID-19 infection please follow these guidelines to prevent infection:  Follow healthcare provider's instructions Make sure that you  understand and can help the patient follow any healthcare provider instructions for all care.  Provide for the patient's basic needs You should help the patient with basic needs in the home and provide support for getting groceries, prescriptions, and other personal needs.  Monitor the patient's symptoms If they are getting sicker, call his or her medical provider and tell them that the patient has, or is being evaluated for, COVID-19 infection. This will help the healthcare provider's office take steps to keep other people from getting infected. Ask the healthcare provider to call the local or state health department.  Limit the number of people who have contact with the patient  If possible, have only one caregiver for the patient.  Other household members should stay in another home or place of residence. If this is not possible, they should stay  in another room, or be separated from the patient as much as possible. Use a separate bathroom, if available.  Restrict visitors who do not have an essential need to be in the home.  Keep older adults, very young children, and other sick people away from the patient Keep older adults, very young children, and those who have compromised immune systems or chronic health conditions away from the patient. This includes people with chronic heart, lung, or kidney conditions, diabetes, and cancer.  Ensure good ventilation Make sure that shared spaces in the home have good air flow, such as from an air conditioner or an opened window, weather permitting.  Wash your hands often  Wash your hands often and thoroughly with soap and water for at least 20 seconds. You can use an alcohol based hand sanitizer if soap and water are not available and if your hands are not visibly dirty.  Avoid touching your eyes, nose, and mouth with unwashed hands.  Use disposable paper towels to dry your hands. If not available, use dedicated cloth towels and replace  them when they become wet.  Wear a facemask and gloves  Wear a disposable facemask at all times in the room and gloves when you touch or have contact with the patient's blood, body fluids, and/or secretions or excretions, such as sweat, saliva, sputum, nasal mucus, vomit, urine, or feces.  Ensure the mask fits over your nose and mouth tightly, and do not touch it during use.  Throw out disposable facemasks and gloves after using them. Do not reuse.  Wash your hands immediately after removing your facemask and gloves.  If your personal clothing becomes contaminated, carefully remove clothing and launder. Wash your hands after handling contaminated clothing.  Place all used disposable facemasks, gloves, and other waste in a lined container before disposing them with other household waste.  Remove gloves and wash your hands immediately after handling these items.  Do not share dishes, glasses, or other household items with the patient  Avoid sharing household items. You should not share dishes, drinking glasses, cups, eating utensils, towels, bedding, or other items with a patient who is confirmed to have, or being evaluated for, COVID-19 infection.  After the person uses these items, you should wash them thoroughly with soap and water.  Wash laundry thoroughly  Immediately remove and wash clothes or bedding that have blood, body fluids, and/or secretions or excretions, such as sweat, saliva, sputum, nasal  mucus, vomit, urine, or feces, on them.  Wear gloves when handling laundry from the patient.  Read and follow directions on labels of laundry or clothing items and detergent. In general, wash and dry with the warmest temperatures recommended on the label.  Clean all areas the individual has used often  Clean all touchable surfaces, such as counters, tabletops, doorknobs, bathroom fixtures, toilets, phones, keyboards, tablets, and bedside tables, every day. Also, clean any surfaces that  may have blood, body fluids, and/or secretions or excretions on them.  Wear gloves when cleaning surfaces the patient has come in contact with.  Use a diluted bleach solution (e.g., dilute bleach with 1 part bleach and 10 parts water) or a household disinfectant with a label that says EPA-registered for coronaviruses. To make a bleach solution at home, add 1 tablespoon of bleach to 1 quart (4 cups) of water. For a larger supply, add  cup of bleach to 1 gallon (16 cups) of water.  Read labels of cleaning products and follow recommendations provided on product labels. Labels contain instructions for safe and effective use of the cleaning product including precautions you should take when applying the product, such as wearing gloves or eye protection and making sure you have good ventilation during use of the product.  Remove gloves and wash hands immediately after cleaning.  Monitor yourself for signs and symptoms of illness Caregivers and household members are considered close contacts, should monitor their health, and will be asked to limit movement outside of the home to the extent possible. Follow the monitoring steps for close contacts listed on the symptom monitoring form.   ? If you have additional questions, contact your local health department or call the epidemiologist on call at [phone] (available 24/7). ? This guidance is subject to change. For the most up-to-date guidance from [facility], please refer to their website: [email or web address]

## 2019-02-15 NOTE — Telephone Encounter (Signed)
Pt called and message left for pt to return call. Pt will need to be scheduled for COVID-19 testing per request of Dr. Nolon Rod. See 2nd telephone encounter for 02/15/19.

## 2019-02-16 ENCOUNTER — Telehealth: Payer: Self-pay | Admitting: Family Medicine

## 2019-02-16 LAB — CMP14+EGFR
ALT: 21 IU/L (ref 0–32)
AST: 24 IU/L (ref 0–40)
Albumin/Globulin Ratio: 1.3 (ref 1.2–2.2)
Albumin: 4.1 g/dL (ref 3.8–4.9)
Alkaline Phosphatase: 66 IU/L (ref 39–117)
BUN/Creatinine Ratio: 17 (ref 9–23)
BUN: 18 mg/dL (ref 6–24)
Bilirubin Total: 0.5 mg/dL (ref 0.0–1.2)
CO2: 21 mmol/L (ref 20–29)
Calcium: 9.3 mg/dL (ref 8.7–10.2)
Chloride: 101 mmol/L (ref 96–106)
Creatinine, Ser: 1.08 mg/dL — ABNORMAL HIGH (ref 0.57–1.00)
GFR calc Af Amer: 68 mL/min/{1.73_m2} (ref >59)
GFR calc non Af Amer: 59 mL/min/{1.73_m2} — ABNORMAL LOW (ref >59)
Globulin, Total: 3.2 g/dL (ref 1.5–4.5)
Glucose: 153 mg/dL — ABNORMAL HIGH (ref 65–99)
Potassium: 3.9 mmol/L (ref 3.5–5.2)
Sodium: 139 mmol/L (ref 134–144)
Total Protein: 7.3 g/dL (ref 6.0–8.5)

## 2019-02-16 LAB — LIPID PANEL
Chol/HDL Ratio: 3.8 ratio (ref 0.0–4.4)
Cholesterol, Total: 157 mg/dL (ref 100–199)
HDL: 41 mg/dL (ref >39)
LDL Calculated: 84 mg/dL (ref 0–99)
Triglycerides: 159 mg/dL — ABNORMAL HIGH (ref 0–149)
VLDL Cholesterol Cal: 32 mg/dL (ref 5–40)

## 2019-02-16 LAB — NOVEL CORONAVIRUS, NAA: SARS-CoV-2, NAA: DETECTED — AB

## 2019-02-16 LAB — HIV ANTIBODY (ROUTINE TESTING W REFLEX): HIV Screen 4th Generation wRfx: NONREACTIVE

## 2019-02-16 NOTE — Progress Notes (Signed)
This encounter was created in error - please disregard.

## 2019-02-16 NOTE — Telephone Encounter (Signed)
Patient notified of her covid positive result She already notified her work and is on isolation for the next 10 days She remains afebrile Only lacking ability to smell and taste

## 2019-02-19 NOTE — Telephone Encounter (Signed)
Pt called requesting another letter and states her employer needs a specific date for her to safely return to work. Please advise.

## 2019-02-22 NOTE — Telephone Encounter (Addendum)
Pt needs a letter stating that she can return to work, and which day?  Pt is active on mychart.

## 2019-02-22 NOTE — Telephone Encounter (Signed)
Spoke with dr stalling re: return to work date-per stallings she would like to talk with pt and re-evaluate for symptoms and then if applicable return to work date and note will be given.  Pt notified via phone and given 02/26/2019 at 11:20 am appt and pt agreeable. Dgaddy, CMA

## 2019-02-22 NOTE — Telephone Encounter (Signed)
Please check with Dr. Kaleen Mask about time frame for note and let me know and I will complete and sent to pt

## 2019-02-26 ENCOUNTER — Ambulatory Visit: Payer: 59 | Admitting: Family Medicine

## 2019-02-26 ENCOUNTER — Other Ambulatory Visit: Payer: Self-pay

## 2019-02-26 DIAGNOSIS — R432 Parageusia: Secondary | ICD-10-CM

## 2019-02-26 DIAGNOSIS — R430 Anosmia: Secondary | ICD-10-CM

## 2019-02-26 DIAGNOSIS — U071 COVID-19: Secondary | ICD-10-CM

## 2019-02-26 NOTE — Patient Instructions (Signed)
° ° ° °  If you have lab work done today you will be contacted with your lab results within the next 2 weeks.  If you have not heard from us then please contact us. The fastest way to get your results is to register for My Chart. ° ° °IF you received an x-ray today, you will receive an invoice from [facility]. Please contact [facility] at [phone] with questions or concerns regarding your invoice.  ° °IF you received labwork today, you will receive an invoice from [facility]. Please contact [facility] at [phone] with questions or concerns regarding your invoice.  ° °Our billing staff will not be able to assist you with questions regarding bills from these companies. ° °You will be contacted with the lab results as soon as they are available. The fastest way to get your results is to activate your My Chart account. Instructions are located on the last page of this paperwork. If you have not heard from us regarding the results in 2 weeks, please contact this office. °  ° ° ° °

## 2019-02-26 NOTE — Progress Notes (Signed)
Telemedicine Encounter- SOAP NOTE Established Patient  This telephone encounter was conducted with the patient's (or proxy's) verbal consent via audio telecommunications: yes/no: Yes Patient was instructed to have this encounter in a suitably private space; and to only have persons present to whom they give permission to participate. In addition, patient identity was confirmed by use of name plus two identifiers (DOB and address).  I discussed the limitations, risks, security and privacy concerns of performing an evaluation and management service by telephone and the availability of in person appointments. I also discussed with the patient that there may be a patient responsible charge related to this service. The patient expressed understanding and agreed to proceed.  I spent a total of TIME; 0 MIN TO 60 MIN: 15 minutes talking with the patient or their proxy.  Chief Complaint  Patient presents with  . COVID 19    positive test follow up.  Patient states her taste and smell is back.  Denies any other symptoms including no fever  . work note    wants another work note    Subjective   Virginia [name] is a 53 y.o. established patient. Telephone visit today for covid positive and return to work  HPI   Pt reports that she has gotten her taste back and feel much better She only had lost of taste and smell She denies fevers, chills, sneezing, sore throat, cough, diarrhea or stool changes She has been doing isolation at home  Patient Active Problem List   Diagnosis Date Noted  . Carpal tunnel syndrome of right wrist 11/18/2017  . Diabetes ([place]) 02/08/2015  . Essential hypertension 02/08/2015    Past Medical History:  Diagnosis Date  . Anemia   . Diabetes mellitus without complication ([place])   . Hyperlipemia   . Hypertension   . Thyroid disease     Current Outpatient Medications  Medication Sig Dispense Refill  . Alcohol Swabs (ALCOHOL WIPES) 70 % PADS Use to clean finger  for checking capillary blood glucose 100 each 3  . amLODipine (NORVASC) 10 MG tablet Take 1 tablet (10 mg total) by mouth daily. 90 tablet 0  . atorvastatin (LIPITOR) 20 MG tablet Take 1 tablet (20 mg total) by mouth daily at 6 PM. 90 tablet 3  . blood glucose meter kit and supplies Dispense based on pt/insurance pref. Use up to 4 times daily as directed. (FOR ICD-10 E10.9, E11.9). 1 each 0  . Blood Glucose Monitoring Suppl (TRUE METRIX METER) W/DEVICE KIT Use as directed 1 kit 11  . Cholecalciferol (VITAMIN D3) 400 units tablet Take 2 tablets (800 Units total) by mouth daily. 90 tablet 1  . Dulaglutide (TRULICITY) 1.5 XJ/8.8TG SOPN Inject 1.5 mg into the skin once a week. 4 pen 3  . glucose blood (TRUE METRIX BLOOD GLUCOSE TEST) test strip Use as instructed 100 each 12  . hydrochlorothiazide (HYDRODIURIL) 50 MG tablet Take 1 tablet (50 mg total) by mouth daily. 30 tablet 11  . Insulin Glargine (BASAGLAR KWIKPEN) 100 UNIT/ML SOPN Inject 0.3 mLs (30 Units total) into the skin at bedtime. 5 pen 5  . Insulin Pen Needle 32G X 6 MM MISC Inject 10 units daily at bed time. Advance 2 units every 2 days until a fasting blood sugar of 130. 100 each 11  . levothyroxine (SYNTHROID) 50 MCG tablet Take 1 tablet (50 mcg total) by mouth daily. 90 tablet 0  . losartan-hydrochlorothiazide (HYZAAR) 100-25 MG tablet Take 1 tablet by mouth daily.  90 tablet 0  . metFORMIN (GLUCOPHAGE) 1000 MG tablet Take 1 tablet (1,000 mg total) by mouth 2 (two) times daily with a meal. 60 tablet 0  . TRUEPLUS LANCETS 26G MISC Use as directed! 100 each 11  . Vitamin D, Ergocalciferol, (DRISDOL) 1.25 MG (50000 UT) CAPS capsule Take 1 capsule (50,000 Units total) by mouth every 7 (seven) days. 12 capsule 1   No current facility-administered medications for this visit.     No Known Allergies  Social History   Socioeconomic History  . Marital status: Divorced    Spouse name: Not on file  . Number of children: 1  . Years of  education: Not on file  . Highest education level: Not on file  Occupational History  . Not on file  Social Needs  . Financial resource strain: Not on file  . Food insecurity    Worry: Not on file    Inability: Not on file  . Transportation needs    Medical: Not on file    Non-medical: Not on file  Tobacco Use  . Smoking status: Never Smoker  . Smokeless tobacco: Never Used  Substance and Sexual Activity  . Alcohol use: No  . Drug use: No  . Sexual activity: Never  Lifestyle  . Physical activity    Days per week: Not on file    Minutes per session: Not on file  . Stress: Not on file  Relationships  . Social Herbalist on phone: Not on file    Gets together: Not on file    Attends religious service: Not on file    Active member of club or organization: Not on file    Attends meetings of clubs or organizations: Not on file    Relationship status: Not on file  . Intimate partner violence    Fear of current or ex partner: Not on file    Emotionally abused: Not on file    Physically abused: Not on file    Forced sexual activity: Not on file  Other Topics Concern  . Not on file  Social History Narrative  . Not on file    ROS Review of Systems  Constitutional: Negative for activity change, appetite change, chills and fever.  HENT: Negative for congestion, nosebleeds, trouble swallowing and voice change.   Respiratory: Negative for cough, shortness of breath and wheezing.   Gastrointestinal: Negative for diarrhea, nausea and vomiting.  Genitourinary: Negative for difficulty urinating, dysuria, flank pain and hematuria.  Musculoskeletal: Negative for back pain, joint swelling and neck pain.  Neurological: Negative for dizziness, speech difficulty, light-headedness and numbness.  See HPI. All other review of systems negative.   Objective   Unable to perform exam This was a phone visit  Vitals as reported by the patient: There were no vitals filed for this  visit.  Virginia [name] was seen today for covid 19 and work note.  Diagnoses and all orders for this visit:  COVID-19 virus detected  Loss of taste  Loss of smell   Symptoms have resolved Discussed that she may return to work at 14 days She has to continue wearing her mask She should keep her routine appointment for diabetes and hypertension  I discussed the assessment and treatment plan with the patient. The patient was provided an opportunity to ask questions and all were answered. The patient agreed with the plan and demonstrated an understanding of the instructions.   The patient was advised to call back or  seek an in-person evaluation if the symptoms worsen or if the condition fails to improve as anticipated.  I provided 15 minutes of non-face-to-face time during this encounter.  Forrest Moron, MD  Primary Care at [facility]

## 2019-03-11 ENCOUNTER — Other Ambulatory Visit: Payer: Self-pay | Admitting: Family Medicine

## 2019-03-11 DIAGNOSIS — E119 Type 2 diabetes mellitus without complications: Secondary | ICD-10-CM

## 2019-03-11 DIAGNOSIS — Z794 Long term (current) use of insulin: Secondary | ICD-10-CM

## 2019-03-15 ENCOUNTER — Encounter: Payer: Self-pay | Admitting: Family Medicine

## 2019-03-15 ENCOUNTER — Telehealth (INDEPENDENT_AMBULATORY_CARE_PROVIDER_SITE_OTHER): Payer: 59 | Admitting: Family Medicine

## 2019-03-15 VITALS — BP 138/70 | Ht 62.5 in | Wt 308.0 lb

## 2019-03-15 DIAGNOSIS — S81802A Unspecified open wound, left lower leg, initial encounter: Secondary | ICD-10-CM

## 2019-03-15 NOTE — Progress Notes (Signed)
Patient states a few weeks ago she hit her left lower leg on the side of her bed.  She states she has a closed wound with scab and her leg has bruising and swelling.  Movement makes the pain worse especially at night while moving around in bed.  The bruising/swelling is localized with no spreading according to her knowledge.  She states that her leg is not getting better or worse. Patient denies any other symptoms.   Patient pharmacy was verified and is updated in her chart.   Denies any recent travel/exposure.

## 2019-03-15 NOTE — Progress Notes (Signed)
Virtual Visit via Telephone Note  I connected with Virginia Virginia [name] on 03/15/19 at 5:33 PM by telephone and verified that I am speaking with the correct person using two identifiers.   I discussed the limitations, risks, security and privacy concerns of performing an evaluation and management service by telephone and the availability of in person appointments. I also discussed with the patient that there may be [name] patient responsible charge related to this service. The patient expressed understanding and agreed to proceed, consent obtained  Chief complaint:  Leg bruising/swelling.   History of Present Illness: Virginia Virginia [name] is [name] 53 y.o. female   Left leg wound:  Making bed few weeks ago caught leg on end rail - small cut, less than width of dime. Minimal initial bleeding.  Was applying neosporin.  Appeared to be scabbed, healing, but bruised appearing Virginia [name] around wound past week. Feels firm/tight around Virginia [name] past week or so. Some throbbing around Virginia [name]. No spreading redness. No pus/drainage.  No fever.  No red streaks.  No proximal pain.  Not worse or better, just more noticeable.   Tx: neosporin daily, band aid.     Patient Active Problem List   Diagnosis Date Noted   Carpal tunnel syndrome of right wrist 11/18/2017   Diabetes ([place]) 02/08/2015   Essential hypertension 02/08/2015   Past Medical History:  Diagnosis Date   Anemia    Diabetes mellitus without complication ([place])    Hyperlipemia    Hypertension    Thyroid disease    Past Surgical History:  Procedure Laterality Date   ABDOMINAL HYSTERECTOMY     APPENDECTOMY     COLONOSCOPY WITH PROPOFOL N/[name] 10/07/2017   Procedure: COLONOSCOPY WITH PROPOFOL;  Surgeon: Virginia Stabler, MD;  Location: [facility];  Service: Gastroenterology;  Laterality: N/[name];   No Known Allergies Prior to Admission medications   Medication Sig Start Date End Date Taking? Authorizing Provider  Alcohol Swabs (ALCOHOL WIPES)  70 % PADS Use to clean finger for checking capillary blood glucose 01/14/19  Yes Virginia Virginia [name], Virginia A, MD  amLODipine (NORVASC) 10 MG tablet Take 1 tablet (10 mg total) by mouth daily. 01/14/19  Yes Virginia Moron, MD  atorvastatin (LIPITOR) 20 MG tablet Take 1 tablet (20 mg total) by mouth daily at 6 PM. 01/14/19  Yes Virginia Virginia [name], Virginia A, MD  blood glucose meter kit and supplies Dispense based on pt/insurance pref. Use up to 4 times daily as directed. (FOR ICD-10 E10.9, E11.9). 01/14/19  Yes Virginia Moron, MD  Blood Glucose Monitoring Suppl (TRUE METRIX METER) W/DEVICE KIT Use as directed 02/08/15  Yes Virginia Chapman, NP  Cholecalciferol (VITAMIN D3) 400 units tablet Take 2 tablets (800 Units total) by mouth daily. 02/08/18  Yes Virginia [name], Virginia Mink, PA-C  Dulaglutide (TRULICITY) 1.5 DX/8.3JA SOPN Inject 1.5 mg into the skin once [name] week. 01/14/19  Yes Virginia Virginia [name], Virginia A, MD  glucose blood (TRUE METRIX BLOOD GLUCOSE TEST) test strip Use as instructed 02/08/15  Yes Virginia Chapman, NP  hydrochlorothiazide (HYDRODIURIL) 50 MG tablet Take 1 tablet (50 mg total) by mouth daily. 12/02/17  Yes Virginia [name], Virginia Mink, PA-C  Insulin Glargine (BASAGLAR KWIKPEN) 100 UNIT/ML SOPN Inject 0.3 mLs (30 Units total) into the skin at bedtime. 02/15/19  Yes Virginia Virginia [name], Virginia A, MD  Insulin Pen Needle 32G X 6 MM MISC Inject 10 units daily at bed time. Advance 2 units every 2 days until [name] fasting blood sugar of 130. 08/08/17  Yes Virginia [name],  Virginia Mink, PA-C  levothyroxine (SYNTHROID) 50 MCG tablet Take 1 tablet (50 mcg total) by mouth daily. 01/14/19  Yes Virginia Virginia [name], Virginia A, MD  losartan-hydrochlorothiazide (HYZAAR) 100-25 MG tablet Take 1 tablet by mouth daily. 01/14/19  Yes Virginia Virginia [name], Virginia A, MD  metFORMIN (GLUCOPHAGE) 1000 MG tablet TAKE 1 TABLET(1000 MG) BY MOUTH TWICE DAILY WITH [name] MEAL 03/11/19  Yes Virginia Moron, MD  TRUEPLUS LANCETS 26G MISC Use as directed! 02/08/15  Yes Virginia Chapman, NP  Vitamin D,  Ergocalciferol, (DRISDOL) 1.25 MG (50000 UT) CAPS capsule Take 1 capsule (50,000 Units total) by mouth every 7 (seven) days. 01/14/19  Yes Virginia Moron, MD   Social History   Socioeconomic History   Marital status: Divorced    Spouse name: Not on file   Number of children: 1   Years of education: Not on file   Highest education level: Not on file  Occupational History   Not on file  Social Needs   Financial resource strain: Not on file   Food insecurity    Worry: Not on file    Inability: Not on file   Transportation needs    Medical: Not on file    Non-medical: Not on file  Tobacco Use   Smoking status: Never Smoker   Smokeless tobacco: Never Used  Substance and Sexual Activity   Alcohol use: No   Drug use: No   Sexual activity: Never  Lifestyle   Physical activity    Days per week: Not on file    Minutes per session: Not on file   Stress: Not on file  Relationships   Social connections    Talks on phone: Not on file    Gets together: Not on file    Attends religious service: Not on file    Active member of club or organization: Not on file    Attends meetings of clubs or organizations: Not on file    Relationship status: Not on file   Intimate partner violence    Fear of current or ex partner: Not on file    Emotionally abused: Not on file    Physically abused: Not on file    Forced sexual activity: Not on file  Other Topics Concern   Not on file  Social History Narrative   Not on file     Observations/Objective: No distress on phone.   Assessment and Plan: Wound of left lower extremity, initial encounter - Plan:   -Difficult to assess wound over phone, and even potentially with video given difficulty with palpating for induration, and possible need for aspiration  -We will refer to an office visit in the next 3 days, symptomatic care discussed in the meantime.  RTC/ER precautions given in the meantime   Follow Up  Instructions: Patient Instructions   Ice over Virginia [name] if needed, continue bandage and neosporin and keep clean and covered.  Return in 3 days in office to evaluate wound further.   Return to the clinic or go to the nearest emergency room if any of your symptoms worsen or new symptoms occur.    If you have lab work done today you will be contacted with your lab results within the next 2 weeks.  If you have not heard from [place] then please contact us. The fastest way to get your results is to register for My Chart.   IF you received an x-ray today, you will receive an invoice from [facility]. Please contact [facility]  at [phone] with questions or concerns regarding your invoice.   IF you received labwork today, you will receive an invoice from [facility]. Please contact [facility] at [phone] with questions or concerns regarding your invoice.   Our billing staff will not be able to assist you with questions regarding bills from these companies.  You will be contacted with the lab results as soon as they are available. The fastest way to get your results is to activate your My Chart account. Instructions are located on the last page of this paperwork. If you have not heard from [place] regarding the results in 2 weeks, please contact this office.          I discussed the assessment and treatment plan with the patient. The patient was provided an opportunity to ask questions and all were answered. The patient agreed with the plan and demonstrated an understanding of the instructions.   The patient was advised to call back or seek an in-person evaluation if the symptoms worsen or if the condition fails to improve as anticipated.  I provided 10 minutes of non-face-to-face time during this encounter.  Signed,   Merri Ray, MD Primary Care at [facility].  03/15/19

## 2019-03-15 NOTE — Patient Instructions (Addendum)
Ice over area if needed, continue bandage and neosporin and keep clean and covered.  Return in 3 days in office to evaluate wound further.   Return to the clinic or go to the nearest emergency room if any of your symptoms worsen or new symptoms occur.    If you have lab work done today you will be contacted with your lab results within the next 2 weeks.  If you have not heard from [place] then please contact us. The fastest way to get your results is to register for My Chart.   IF you received an x-ray today, you will receive an invoice from [facility]. Please contact [facility] at [phone] with questions or concerns regarding your invoice.   IF you received labwork today, you will receive an invoice from [facility]. Please contact [facility] at [phone] with questions or concerns regarding your invoice.   Our billing staff will not be able to assist you with questions regarding bills from these companies.  You will be contacted with the lab results as soon as they are available. The fastest way to get your results is to activate your My Chart account. Instructions are located on the last page of this paperwork. If you have not heard from [place] regarding the results in 2 weeks, please contact this office.

## 2019-03-18 ENCOUNTER — Ambulatory Visit: Payer: 59 | Admitting: Family Medicine

## 2019-04-15 ENCOUNTER — Other Ambulatory Visit: Payer: Self-pay | Admitting: Family Medicine

## 2019-04-15 NOTE — Telephone Encounter (Signed)
Forwarding medication refill request to Clinical Pool for review.

## 2019-04-16 ENCOUNTER — Other Ambulatory Visit: Payer: Self-pay

## 2019-04-16 NOTE — Telephone Encounter (Signed)
Sent instant message to stallings about refills.  Awaiting a response and will address accordingly. Dgaddy, CMA

## 2019-05-02 ENCOUNTER — Other Ambulatory Visit: Payer: Self-pay | Admitting: Family Medicine

## 2019-05-02 DIAGNOSIS — E119 Type 2 diabetes mellitus without complications: Secondary | ICD-10-CM

## 2019-05-04 ENCOUNTER — Other Ambulatory Visit: Payer: Self-pay | Admitting: Family Medicine

## 2022-10-25 ENCOUNTER — Ambulatory Visit (INDEPENDENT_AMBULATORY_CARE_PROVIDER_SITE_OTHER): Payer: 59

## 2022-10-25 ENCOUNTER — Ambulatory Visit: Payer: 59 | Admitting: Podiatry

## 2022-10-25 DIAGNOSIS — M722 Plantar fascial fibromatosis: Secondary | ICD-10-CM | POA: Diagnosis not present

## 2022-10-25 MED ORDER — MELOXICAM 15 MG PO TABS: 15.0000 mg | ORAL_TABLET | Freq: Every day | ORAL | 1 refills | Status: AC

## 2022-10-25 NOTE — Progress Notes (Signed)
   Chief Complaint  Patient presents with   Foot Pain    Left foot pain, ball of foot near 5th toe radiating to lateral aspect of foot. Throbbing pain and hurts only when patient is standing.     Subjective: 57 y.o. female presenting today for evaluation of left heel pain has been ongoing for about 1 month now.  Gradual onset.  Denies a history of injury.  She has not done anything currently for treatment.  She says that over the past few weeks she has noticed significant improvement of her heel pain.   Past Medical History:  Diagnosis Date   Anemia    Diabetes mellitus without complication ([place])    Hyperlipemia    Hypertension    Thyroid disease      Objective: Physical Exam General: The patient is alert and oriented x3 in no acute distress.  Dermatology: Skin is warm, dry and supple bilateral lower extremities. Negative for open lesions or macerations bilateral.   Vascular: Dorsalis Pedis and Posterior Tibial pulses palpable bilateral.  Capillary fill time is immediate to all digits.  Neurological: Epicritic and protective threshold intact bilateral.   Musculoskeletal: Tenderness to palpation to the plantar aspect of the left heel along the plantar fascia. All other joints range of motion within normal limits bilateral. Strength 5/5 in all groups bilateral.  Pes planus deformity noted with collapse of the medial longitudinal arch  Radiographic exam LT foot 10/25/2022: Normal osseous mineralization.  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  No acute fractures identified.  Plantar heel spur noted.  Pes planovalgus deformity with collapse of the medial longitudinal arch best visualized on lateral view noted.  Assessment: 1. Plantar fasciitis left foot 2.  Pes planus left -Patient evaluated.  X-rays reviewed -Patient states that she has had some relief and improvement over the past 2-3 weeks.  No injection today -Prescription for meloxicam 15 mg  daily as needed -Advised against going barefoot.  Recommend good supportive shoes and sneakers -Return to clinic as needed   Edrick Kins, DPM [facility]  Dr. Edrick Kins, DPM    [address], [place] [address]                Office [phone]  Fax [phone]

## 2023-07-25 ENCOUNTER — Emergency Department (HOSPITAL_COMMUNITY)
Admission: EM | Admit: 2023-07-25 | Discharge: 2023-07-26 | Disposition: A | Discharge status: Home / Self Care | Payer: 59 | Attending: Emergency Medicine | Admitting: Emergency Medicine

## 2023-07-25 ENCOUNTER — Emergency Department (HOSPITAL_COMMUNITY): Payer: 59

## 2023-07-25 ENCOUNTER — Other Ambulatory Visit: Payer: Self-pay

## 2023-07-25 ENCOUNTER — Encounter (HOSPITAL_COMMUNITY): Payer: Self-pay | Admitting: *Deleted

## 2023-07-25 DIAGNOSIS — Z4800 Encounter for change or removal of nonsurgical wound dressing: Secondary | ICD-10-CM | POA: Insufficient documentation

## 2023-07-25 DIAGNOSIS — M7989 Other specified soft tissue disorders: Secondary | ICD-10-CM | POA: Insufficient documentation

## 2023-07-25 DIAGNOSIS — Z794 Long term (current) use of insulin: Secondary | ICD-10-CM | POA: Insufficient documentation

## 2023-07-25 DIAGNOSIS — E119 Type 2 diabetes mellitus without complications: Secondary | ICD-10-CM | POA: Diagnosis not present

## 2023-07-25 DIAGNOSIS — Z7984 Long term (current) use of oral hypoglycemic drugs: Secondary | ICD-10-CM | POA: Insufficient documentation

## 2023-07-25 DIAGNOSIS — Z5189 Encounter for other specified aftercare: Secondary | ICD-10-CM

## 2023-07-25 DIAGNOSIS — I10 Essential (primary) hypertension: Secondary | ICD-10-CM | POA: Insufficient documentation

## 2023-07-25 DIAGNOSIS — Z79899 Other long term (current) drug therapy: Secondary | ICD-10-CM | POA: Diagnosis not present

## 2023-07-25 LAB — COMPREHENSIVE METABOLIC PANEL
ALT: 23 U/L (ref 0–44)
AST: 18 U/L (ref 15–41)
Albumin: 3.4 g/dL — ABNORMAL LOW (ref 3.5–5.0)
Alkaline Phosphatase: 56 U/L (ref 38–126)
Anion gap: 7 (ref 5–15)
BUN: 26 mg/dL — ABNORMAL HIGH (ref 6–20)
CO2: 23 mmol/L (ref 22–32)
Calcium: 9.3 mg/dL (ref 8.9–10.3)
Chloride: 109 mmol/L (ref 98–111)
Creatinine, Ser: 1.25 mg/dL — ABNORMAL HIGH (ref 0.44–1.00)
GFR, Estimated: 50 mL/min — ABNORMAL LOW (ref >60)
Glucose, Bld: 122 mg/dL — ABNORMAL HIGH (ref 70–99)
Potassium: 4.1 mmol/L (ref 3.5–5.1)
Sodium: 139 mmol/L (ref 135–145)
Total Bilirubin: 0.8 mg/dL (ref <1.2)
Total Protein: 7.1 g/dL (ref 6.5–8.1)

## 2023-07-25 LAB — CBC WITH DIFFERENTIAL/PLATELET
Abs Immature Granulocytes: 0.05 10*3/uL (ref 0.00–0.07)
Basophils Absolute: 0.1 10*3/uL (ref 0.0–0.1)
Basophils Relative: 1 %
Eosinophils Absolute: 0.2 10*3/uL (ref 0.0–0.5)
Eosinophils Relative: 2 %
HCT: 32.3 % — ABNORMAL LOW (ref 36.0–46.0)
Hemoglobin: 10.4 g/dL — ABNORMAL LOW (ref 12.0–15.0)
Immature Granulocytes: 0 %
Lymphocytes Relative: 18 %
Lymphs Abs: 2.3 10*3/uL (ref 0.7–4.0)
MCH: 30.3 pg (ref 26.0–34.0)
MCHC: 32.2 g/dL (ref 30.0–36.0)
MCV: 94.2 fL (ref 80.0–100.0)
Monocytes Absolute: 0.9 10*3/uL (ref 0.1–1.0)
Monocytes Relative: 7 %
Neutro Abs: 9.4 10*3/uL — ABNORMAL HIGH (ref 1.7–7.7)
Neutrophils Relative %: 72 %
Platelets: 390 10*3/uL (ref 150–400)
RBC: 3.43 MIL/uL — ABNORMAL LOW (ref 3.87–5.11)
RDW: 13.4 % (ref 11.5–15.5)
WBC: 12.9 10*3/uL — ABNORMAL HIGH (ref 4.0–10.5)
nRBC: 0 % (ref 0.0–0.2)

## 2023-07-25 NOTE — ED Provider Triage Note (Signed)
Emergency Medicine Provider Triage Evaluation Note  Virginia [name] , a 57 y.o. female  was evaluated in triage.  Pt complains of pain of her right calf.  States she has had a wound there for a long time that has been taking care of by wound management.  At her visit yesterday, they did a vascular study which seem to aggravate her pain.   Review of Systems  Positive: As above Negative: As above  Physical Exam  BP 135/84 (BP Location: Right Arm)   Pulse (!) 103   Temp 100.2 F (37.9 C) (Oral)   Resp 20   Ht 5\' 2"  (1.575 m)   Wt (!) 139.7 kg   SpO2 100%   BMI 56.33 kg/m  Gen:   Awake, no distress   Resp:  Normal effort  MSK:   Moves extremities without difficulty  Other:  Necrotic appearing ulceration of the posterior right calf  Tachycardic to cardiac auscultation  Medical Decision Making  Medically screening exam initiated at 8:20 PM.  Appropriate orders placed.  Devonne Doughty was informed that the remainder of the evaluation will be completed by another provider, this initial triage assessment does not replace that evaluation, and the importance of remaining in the ED until their evaluation is complete.     Arabella Merles, PA-C 07/25/23 2022

## 2023-07-25 NOTE — ED Triage Notes (Signed)
The pt is c/o rt lower leg  she has a wound on her rt lower leg   she was seen at the wound care center  yesterday and she reports that it has been throbbing since she was seen

## 2023-07-26 ENCOUNTER — Ambulatory Visit (HOSPITAL_COMMUNITY): Admission: RE | Admit: 2023-07-26 | Payer: 59 | Source: Ambulatory Visit

## 2023-07-26 MED ORDER — OXYCODONE-ACETAMINOPHEN 5-325 MG PO TABS
1.0000 | ORAL_TABLET | Freq: Once | ORAL | Status: AC
  Administered 2023-07-26: 1 via ORAL
  Filled 2023-07-26: qty 1

## 2023-07-26 MED ORDER — DOXYCYCLINE HYCLATE 100 MG PO CAPS: 100.0000 mg | ORAL_CAPSULE | Freq: Two times a day (BID) | ORAL | 0 refills | Status: AC

## 2023-07-26 NOTE — ED Provider Notes (Signed)
[place] EMERGENCY DEPARTMENT AT [facility] Provider Note   CSN: [id number] Arrival date & time: 07/25/23  1937     History  Chief Complaint  Patient presents with   Wound Check    Virginia [name] is a 57 y.o. female.  The history is provided by the patient and medical records. No language interpreter was used.  Wound Check     57 year old female significant history of diabetes, hypertension, thyroid disease, anemia, chronic leg wound presenting with concerning of wound pain.  Patient has a wound to her right calf ongoing for more than a month.  She endorsed increasing pain to the site of the wound.  She has been cared for by the wound care center.  She noticed an odor coming from the site ongoing for the past 2 weeks.  She did mention it to the wound care provider but states no specific treatment was given.  She is self-conscious about the odor.  She was last seen by her wound care specialist 3 days ago.  Yesterday she had a vascular study done on her right lower extremity.  States that she had a blood pressure cuff placed on the leg and after the procedure she endorsed increasing pain to her wound.  She went home she tried to take over-the-counter medication including Tylenol without adequate relief of pain thus prompting this ER visit.  Otherwise patient denies having fever or chills she has not noticed any drainage or any increasing welling to the site.  She is not on antibiotic at this time.    Home Medications Prior to Admission medications   Medication Sig Start Date End Date Taking? Authorizing Provider  Alcohol Swabs (ALCOHOL WIPES) 70 % PADS Use to clean finger for checking capillary blood glucose 01/14/19   Stallings, Zoe A, MD  amLODipine (NORVASC) 10 MG tablet Take 1 tablet (10 mg total) by mouth daily. 01/14/19   Doristine Bosworth, MD  atorvastatin (LIPITOR) 20 MG tablet Take 1 tablet (20 mg total) by mouth daily at 6 PM. 01/14/19   Doristine Bosworth, MD  blood  glucose meter kit and supplies Dispense based on pt/insurance pref. Use up to 4 times daily as directed. (FOR ICD-10 E10.9, E11.9). 01/14/19   Doristine Bosworth, MD  Blood Glucose Monitoring Suppl (TRUE METRIX METER) W/DEVICE KIT Use as directed 02/08/15   Henrietta Dall, NP  Cholecalciferol (VITAMIN D3) 400 units tablet Take 2 tablets (800 Units total) by mouth daily. 02/08/18   McVey, Madelaine Bhat, PA-C  glucose blood (TRUE METRIX BLOOD GLUCOSE TEST) test strip Use as instructed 02/08/15   Henrietta Tippy, NP  hydrochlorothiazide (HYDRODIURIL) 50 MG tablet Take 1 tablet (50 mg total) by mouth daily. 12/02/17   McVey, Madelaine Bhat, PA-C  Insulin Glargine (BASAGLAR KWIKPEN) 100 UNIT/ML SOPN Inject 0.3 mLs (30 Units total) into the skin at bedtime. 02/15/19   Doristine Bosworth, MD  Insulin Pen Needle 32G X 6 MM MISC Inject 10 units daily at bed time. Advance 2 units every 2 days until a fasting blood sugar of 130. 08/08/17   McVey, Madelaine Bhat, PA-C  levothyroxine (SYNTHROID) 50 MCG tablet TAKE 1 TABLET BY MOUTH DAILY 04/16/19   Doristine Bosworth, MD  losartan-hydrochlorothiazide (HYZAAR) 100-25 MG tablet TAKE 1 TABLET BY MOUTH DAILY 04/16/19   Doristine Bosworth, MD  meloxicam (MOBIC) 15 MG tablet Take 1 tablet (15 mg total) by mouth daily. 10/25/22   Felecia Shelling, DPM  metFORMIN (  GLUCOPHAGE) 1000 MG tablet TAKE 1 TABLET(1000 MG) BY MOUTH TWICE DAILY WITH A MEAL 05/03/19   Doristine Bosworth, MD  TRUEPLUS LANCETS 26G MISC Use as directed! 02/08/15   Henrietta Flax, NP  TRULICITY 1.5 MG/0.5ML SOPN ADMINISTER 0.5 ML( 1.5 MG) UNDER THE SKIN 1 TIME A WEEK 05/04/19   Doristine Bosworth, MD  Vitamin D, Ergocalciferol, (DRISDOL) 1.25 MG (50000 UT) CAPS capsule Take 1 capsule (50,000 Units total) by mouth every 7 (seven) days. 01/14/19   Doristine Bosworth, MD      Allergies    Patient has no known allergies.    Review of Systems   Review of Systems  All other systems reviewed and are  negative.   Physical Exam Updated Vital Signs BP 135/84 (BP Location: Right Arm)   Pulse (!) 103   Temp 100.2 F (37.9 C) (Oral)   Resp 20   Ht 5\' 2"  (1.575 m)   Wt (!) 139.7 kg   SpO2 100%   BMI 56.33 kg/m  Physical Exam Vitals and nursing note reviewed.  Constitutional:      General: She is not in acute distress.    Appearance: She is well-developed.  HENT:     Head: Atraumatic.  Eyes:     Conjunctiva/sclera: Conjunctivae normal.  Pulmonary:     Effort: Pulmonary effort is normal.  Musculoskeletal:     Cervical back: Neck supple.  Skin:    Findings: No rash.     Comments: Right posterior calf there is a quarter size ulceration approximately 4 mm in depth with good granular tissue and no surrounding skin erythema.  It is mildly tender to palpation.  Dorsalis pedis pulse palpable with brisk cap refill.  Some dependent edema noted to right lower ankle.  Neurological:     Mental Status: She is alert.  Psychiatric:        Mood and Affect: Mood normal.     ED Results / Procedures / Treatments   Labs (all labs ordered are listed, but only abnormal results are displayed) Labs Reviewed  CBC WITH DIFFERENTIAL/PLATELET - Abnormal; Notable for the following components:      Result Value   WBC 12.9 (*)    RBC 3.43 (*)    Hemoglobin 10.4 (*)    HCT 32.3 (*)    Neutro Abs 9.4 (*)    All other components within normal limits  COMPREHENSIVE METABOLIC PANEL - Abnormal; Notable for the following components:   Glucose, Bld 122 (*)    BUN 26 (*)    Creatinine, Ser 1.25 (*)    Albumin 3.4 (*)    GFR, Estimated 50 (*)    All other components within normal limits  I-STAT CG4 LACTIC ACID, ED    EKG None  Radiology DG Tibia/Fibula Right  Result Date: 07/25/2023 CLINICAL DATA:  Wound on posterior calf.  Concern for osteomyelitis. EXAM: RIGHT TIBIA AND FIBULA - 2 VIEW COMPARISON:  None Available. FINDINGS: There is no evidence of fracture or other focal bone lesions. No  erosions or periostitis. No bone destruction. Generalized soft tissue edema. Focus of air in the soft tissues posteriorly may represent site of wound. No tracking soft tissue gas. No radiopaque foreign body. Vascular calcifications are seen. IMPRESSION: 1. Generalized soft tissue edema. Focus of air in the soft tissues posteriorly may represent site of wound. No tracking soft tissue gas. 2. No radiographic evidence of osteomyelitis. Electronically Signed   By: Narda Rutherford M.D.   On:  07/25/2023 23:17    Procedures Procedures    Medications Ordered in ED Medications  oxyCODONE-acetaminophen (PERCOCET/ROXICET) 5-325 MG per tablet 1 tablet (1 tablet Oral Given 07/26/23 0316)    ED Course/ Medical Decision Making/ A&P                                 Medical Decision Making Risk Prescription drug management.   BP 129/80 (BP Location: Right Arm)   Pulse 85   Temp 98.1 F (36.7 C) (Oral)   Resp 17   Ht 5\' 2"  (1.575 m)   Wt (!) 139.7 kg   SpO2 100%   BMI 56.33 kg/m   44:12 AM  56 year old female significant history of diabetes, hypertension, thyroid disease, anemia, chronic leg wound presenting with concerning of wound pain.  Patient has a wound to her right calf ongoing for more than a month.  She endorsed increasing pain to the site of the wound.  She has been cared for by the wound care center.  She noticed an odor coming from the site ongoing for the past 2 weeks.  She did mention it to the wound care provider but states no specific treatment was given.  She is self-conscious about the odor.  She was last seen by her wound care specialist 3 days ago.  Yesterday she had a vascular study done on her right lower extremity.  States that she had a blood pressure cuff placed on the leg and after the procedure she endorsed increasing pain to her wound.  She went home she tried to take over-the-counter medication including Tylenol without adequate relief of pain thus prompting this ER visit.   Otherwise patient denies having fever or chills she has not noticed any drainage or any increasing welling to the site.  She is not on antibiotic at this time.  -Labs ordered, independently viewed and interpreted by me.  Labs remarkable for WBC 12.9.  Cr 1.25.  -The patient was maintained on a cardiac monitor.  I personally viewed and interpreted the cardiac monitored which showed an underlying rhythm of: NSR -Imaging independently viewed and interpreted by me and I agree with radiologist's interpretation.  Result remarkable for xray of R tib/fib with soft tissue edema but no evidence of osteomyelitis -This patient presents to the ED for concern of wound, this involves an extensive number of treatment options, and is a complaint that carries with it a high risk of complications and morbidity.  The differential diagnosis includes chronic venous stasis, diabetic wound ulcer, DVT, cellulitis, abscess, osteomyelitis -Co morbidities that complicate the patient evaluation includes DM -Treatment includes percocet -Reevaluation of the patient after these medicines showed that the patient improved -PCP office notes or outside notes reviewed -Discussion with attending Dr. Manus Gunning -Escalation to admission/observation considered: patients feels much better, is comfortable with discharge, and will follow up with PCP -Prescription medication considered, patient comfortable with doxy -Social Determinant of Health considered   Although no obvious signs of infection since patient does have a low white count and no documented fever, will treat with antibiotic including doxycycline.  Encourage patient to follow-up closely with wound care specialist for further care.         Final Clinical Impression(s) / ED Diagnoses Final diagnoses:  Encounter for wound re-check    Rx / DC Orders ED Discharge Orders          Ordered    doxycycline (VIBRAMYCIN) 100  MG capsule  2 times daily        07/26/23 0503               Fayrene Helper, PA-C 07/26/23 0503    Glynn Octave, MD 07/26/23 [phone]

## 2023-07-26 NOTE — Discharge Instructions (Signed)
Please take antibiotic as prescribed to decrease risk of infection at the wound site.  Follow-up closely with your wound care specialist for further care

## 2023-08-16 ENCOUNTER — Encounter (HOSPITAL_COMMUNITY): Payer: Self-pay

## 2023-08-16 ENCOUNTER — Emergency Department (HOSPITAL_COMMUNITY)
Admission: EM | Admit: 2023-08-16 | Discharge: 2023-08-16 | Disposition: A | Discharge status: Home / Self Care | Payer: 59 | Attending: Emergency Medicine | Admitting: Emergency Medicine

## 2023-08-16 ENCOUNTER — Other Ambulatory Visit: Payer: Self-pay

## 2023-08-16 DIAGNOSIS — S0011XA Contusion of right eyelid and periocular area, initial encounter: Secondary | ICD-10-CM | POA: Insufficient documentation

## 2023-08-16 DIAGNOSIS — Z794 Long term (current) use of insulin: Secondary | ICD-10-CM | POA: Insufficient documentation

## 2023-08-16 DIAGNOSIS — S0511XA Contusion of eyeball and orbital tissues, right eye, initial encounter: Secondary | ICD-10-CM

## 2023-08-16 DIAGNOSIS — S0591XA Unspecified injury of right eye and orbit, initial encounter: Secondary | ICD-10-CM | POA: Diagnosis present

## 2023-08-16 NOTE — ED Notes (Signed)
Ice provided for patient. 
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
## 2023-11-20 ENCOUNTER — Other Ambulatory Visit (HOSPITAL_COMMUNITY): Payer: Self-pay | Admitting: Physician Assistant

## 2023-11-20 DIAGNOSIS — H9120 Sudden idiopathic hearing loss, unspecified ear: Secondary | ICD-10-CM

## 2023-11-27 ENCOUNTER — Ambulatory Visit (HOSPITAL_COMMUNITY)
Admission: RE | Admit: 2023-11-27 | Discharge: 2023-11-27 | Disposition: A | Discharge status: Home / Self Care | Source: Ambulatory Visit | Attending: Physician Assistant | Admitting: Physician Assistant

## 2023-11-27 DIAGNOSIS — H9120 Sudden idiopathic hearing loss, unspecified ear: Secondary | ICD-10-CM | POA: Diagnosis present
